# Patient Record
Sex: Male | Born: 1995
Health system: Southern US, Community
[De-identification: ages and names within clinical notes are randomized; demographics above are authoritative.]

## PROBLEM LIST (undated history)

## (undated) DIAGNOSIS — S43006A Unspecified dislocation of unspecified shoulder joint, initial encounter: Secondary | ICD-10-CM

## (undated) HISTORY — PX: ORCHIOPEXY: SHX479

## (undated) HISTORY — PX: TYMPANOSTOMY TUBE PLACEMENT: SHX32

## (undated) HISTORY — PX: WISDOM TOOTH EXTRACTION: SHX21

---
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
## 2015-01-01 ENCOUNTER — Ambulatory Visit (HOSPITAL_BASED_OUTPATIENT_CLINIC_OR_DEPARTMENT_OTHER): Payer: 59 | Admitting: Certified Registered"

## 2015-01-01 ENCOUNTER — Encounter (HOSPITAL_BASED_OUTPATIENT_CLINIC_OR_DEPARTMENT_OTHER): Payer: Self-pay | Admitting: Certified Registered"

## 2015-01-01 ENCOUNTER — Ambulatory Visit (HOSPITAL_BASED_OUTPATIENT_CLINIC_OR_DEPARTMENT_OTHER)
Admission: RE | Admit: 2015-01-01 | Discharge: 2015-01-01 | Disposition: A | Discharge status: Home / Self Care | Payer: 59 | Source: Ambulatory Visit | Attending: Orthopedic Surgery | Admitting: Orthopedic Surgery

## 2015-01-01 ENCOUNTER — Encounter (HOSPITAL_BASED_OUTPATIENT_CLINIC_OR_DEPARTMENT_OTHER)
Admission: RE | Disposition: A | Discharge status: Home / Self Care | Payer: Self-pay | Source: Ambulatory Visit | Attending: Orthopedic Surgery

## 2015-01-01 DIAGNOSIS — M24111 Other articular cartilage disorders, right shoulder: Secondary | ICD-10-CM | POA: Insufficient documentation

## 2015-01-01 DIAGNOSIS — M25311 Other instability, right shoulder: Secondary | ICD-10-CM | POA: Diagnosis not present

## 2015-01-01 DIAGNOSIS — M75101 Unspecified rotator cuff tear or rupture of right shoulder, not specified as traumatic: Secondary | ICD-10-CM | POA: Insufficient documentation

## 2015-01-01 DIAGNOSIS — S43014A Anterior dislocation of right humerus, initial encounter: Secondary | ICD-10-CM | POA: Diagnosis present

## 2015-01-01 HISTORY — DX: Unspecified dislocation of unspecified shoulder joint, initial encounter: S43.006A

## 2015-01-01 LAB — POCT HEMOGLOBIN-HEMACUE: HEMOGLOBIN: 15.7 g/dL (ref 13.0–17.0)

## 2015-01-01 SURGERY — SHOULDER ATHROSCOPY WITH CAPSULORRHAPHY
Anesthesia: Regional | Site: Shoulder | Laterality: Right

## 2015-01-01 MED ORDER — ROPIVACAINE HCL 5 MG/ML IJ SOLN
INTRAMUSCULAR | Status: DC | PRN
  Administered 2015-01-01: 30 mL via PERINEURAL

## 2015-01-01 MED ORDER — MIDAZOLAM HCL 2 MG/2ML IJ SOLN
INTRAMUSCULAR | Status: AC
  Filled 2015-01-01: qty 2

## 2015-01-01 MED ORDER — FENTANYL CITRATE 0.05 MG/ML IJ SOLN
INTRAMUSCULAR | Status: AC
  Filled 2015-01-01: qty 6

## 2015-01-01 MED ORDER — LIDOCAINE HCL (CARDIAC) 20 MG/ML IV SOLN
INTRAVENOUS | Status: DC | PRN
  Administered 2015-01-01: 30 mg via INTRAVENOUS

## 2015-01-01 MED ORDER — ONDANSETRON HCL 4 MG PO TABS: 4.0000 mg | ORAL_TABLET | Freq: Three times a day (TID) | ORAL | Status: DC | PRN

## 2015-01-01 MED ORDER — SENNA-DOCUSATE SODIUM 8.6-50 MG PO TABS: 2.0000 | ORAL_TABLET | Freq: Every day | ORAL | Status: DC

## 2015-01-01 MED ORDER — MIDAZOLAM HCL 5 MG/5ML IJ SOLN
INTRAMUSCULAR | Status: DC | PRN
  Administered 2015-01-01: 1 mg via INTRAVENOUS

## 2015-01-01 MED ORDER — ONDANSETRON HCL 4 MG/2ML IJ SOLN: 4.0000 mg | Freq: Once | INTRAMUSCULAR | Status: DC | PRN

## 2015-01-01 MED ORDER — MIDAZOLAM HCL 2 MG/ML PO SYRP: 12.0000 mg | ORAL_SOLUTION | Freq: Once | ORAL | Status: DC | PRN

## 2015-01-01 MED ORDER — PROPOFOL 10 MG/ML IV EMUL
INTRAVENOUS | Status: AC
  Filled 2015-01-01: qty 50

## 2015-01-01 MED ORDER — MEPERIDINE HCL 25 MG/ML IJ SOLN: 6.2500 mg | INTRAMUSCULAR | Status: DC | PRN

## 2015-01-01 MED ORDER — FENTANYL CITRATE 0.05 MG/ML IJ SOLN
50.0000 ug | INTRAMUSCULAR | Status: DC | PRN
  Administered 2015-01-01: 100 ug via INTRAVENOUS

## 2015-01-01 MED ORDER — OXYCODONE HCL 5 MG/5ML PO SOLN: 5.0000 mg | Freq: Once | ORAL | Status: DC | PRN

## 2015-01-01 MED ORDER — CEFAZOLIN SODIUM-DEXTROSE 2-3 GM-% IV SOLR
2.0000 g | INTRAVENOUS | Status: AC
  Administered 2015-01-01: 2 g via INTRAVENOUS

## 2015-01-01 MED ORDER — PROPOFOL 10 MG/ML IV BOLUS
INTRAVENOUS | Status: DC | PRN
  Administered 2015-01-01: 200 mg via INTRAVENOUS

## 2015-01-01 MED ORDER — SUCCINYLCHOLINE CHLORIDE 20 MG/ML IJ SOLN
INTRAMUSCULAR | Status: DC | PRN
  Administered 2015-01-01: 100 mg via INTRAVENOUS

## 2015-01-01 MED ORDER — BACLOFEN 10 MG PO TABS: 10.0000 mg | ORAL_TABLET | Freq: Three times a day (TID) | ORAL | Status: DC

## 2015-01-01 MED ORDER — SUCCINYLCHOLINE CHLORIDE 20 MG/ML IJ SOLN
INTRAMUSCULAR | Status: AC
Start: 2015-01-01 — End: 2015-01-01
  Filled 2015-01-01: qty 1

## 2015-01-01 MED ORDER — CEFAZOLIN SODIUM-DEXTROSE 2-3 GM-% IV SOLR
INTRAVENOUS | Status: AC
  Filled 2015-01-01: qty 50

## 2015-01-01 MED ORDER — FENTANYL CITRATE 0.05 MG/ML IJ SOLN
INTRAMUSCULAR | Status: AC
  Filled 2015-01-01: qty 2

## 2015-01-01 MED ORDER — FENTANYL CITRATE 0.05 MG/ML IJ SOLN
INTRAMUSCULAR | Status: DC | PRN
  Administered 2015-01-01: 50 ug via INTRAVENOUS

## 2015-01-01 MED ORDER — OXYCODONE HCL 5 MG PO TABS: 5.0000 mg | ORAL_TABLET | Freq: Once | ORAL | Status: DC | PRN

## 2015-01-01 MED ORDER — LACTATED RINGERS IV SOLN
INTRAVENOUS | Status: DC
  Administered 2015-01-01 (×2): via INTRAVENOUS

## 2015-01-01 MED ORDER — OXYCODONE-ACETAMINOPHEN 5-325 MG PO TABS: 1.0000 | ORAL_TABLET | Freq: Four times a day (QID) | ORAL | Status: DC | PRN

## 2015-01-01 MED ORDER — HYDROMORPHONE HCL 1 MG/ML IJ SOLN
INTRAMUSCULAR | Status: AC
  Filled 2015-01-01: qty 1

## 2015-01-01 MED ORDER — MIDAZOLAM HCL 2 MG/2ML IJ SOLN
1.0000 mg | INTRAMUSCULAR | Status: DC | PRN
  Administered 2015-01-01: 2 mg via INTRAVENOUS

## 2015-01-01 MED ORDER — DEXAMETHASONE SODIUM PHOSPHATE 4 MG/ML IJ SOLN
INTRAMUSCULAR | Status: DC | PRN
  Administered 2015-01-01: 10 mg via INTRAVENOUS

## 2015-01-01 MED ORDER — ONDANSETRON HCL 4 MG/2ML IJ SOLN
INTRAMUSCULAR | Status: DC | PRN
  Administered 2015-01-01: 4 mg via INTRAVENOUS

## 2015-01-01 MED ORDER — HYDROMORPHONE HCL 1 MG/ML IJ SOLN
0.2500 mg | INTRAMUSCULAR | Status: DC | PRN
  Administered 2015-01-01: 0.5 mg via INTRAVENOUS

## 2015-01-01 SURGICAL SUPPLY — 63 items
ANCHOR SUT BIOCOMP LK 2.9X12.5 (Anchor) ×4 IMPLANT
BLADE CUTTER GATOR 3.5 (BLADE) ×2 IMPLANT
BLADE GREAT WHITE 4.2 (BLADE) IMPLANT
BLADE SURG 15 STRL LF DISP TIS (BLADE) IMPLANT
BLADE SURG 15 STRL SS (BLADE)
BUR OVAL 6.0 (BURR) IMPLANT
CANNULA 5.75X71 LONG (CANNULA) ×2 IMPLANT
CANNULA TWIST IN 8.25X7CM (CANNULA) ×2 IMPLANT
CANNULA TWIST IN 8.25X9CM (CANNULA) IMPLANT
CLSR STERI-STRIP ANTIMIC 1/2X4 (GAUZE/BANDAGES/DRESSINGS) ×2 IMPLANT
DECANTER SPIKE VIAL GLASS SM (MISCELLANEOUS) IMPLANT
DRAPE INCISE IOBAN 66X45 STRL (DRAPES) ×4 IMPLANT
DRAPE SHOULDER BEACH CHAIR (DRAPES) ×2 IMPLANT
DRAPE U 20/CS (DRAPES) ×2 IMPLANT
DRAPE U-SHAPE 47X51 STRL (DRAPES) ×2 IMPLANT
DRSG PAD ABDOMINAL 8X10 ST (GAUZE/BANDAGES/DRESSINGS) ×2 IMPLANT
DURAPREP 26ML APPLICATOR (WOUND CARE) ×2 IMPLANT
ELECT REM PT RETURN 9FT ADLT (ELECTROSURGICAL)
ELECTRODE REM PT RTRN 9FT ADLT (ELECTROSURGICAL) IMPLANT
FIBERSTICK 2 (SUTURE) IMPLANT
GAUZE SPONGE 4X4 12PLY STRL (GAUZE/BANDAGES/DRESSINGS) ×2 IMPLANT
GLOVE BIO SURGEON STRL SZ8 (GLOVE) ×2 IMPLANT
GLOVE BIOGEL PI IND STRL 8 (GLOVE) ×2 IMPLANT
GLOVE BIOGEL PI INDICATOR 8 (GLOVE) ×2
GLOVE ORTHO TXT STRL SZ7.5 (GLOVE) ×2 IMPLANT
GOWN STRL REUS W/ TWL LRG LVL3 (GOWN DISPOSABLE) ×1 IMPLANT
GOWN STRL REUS W/ TWL XL LVL3 (GOWN DISPOSABLE) ×2 IMPLANT
GOWN STRL REUS W/TWL LRG LVL3 (GOWN DISPOSABLE) ×1
GOWN STRL REUS W/TWL XL LVL3 (GOWN DISPOSABLE) ×2
IMMOBILIZER SHOULDER FOAM XLGE (SOFTGOODS) IMPLANT
IV NS IRRIG 3000ML ARTHROMATIC (IV SOLUTION) ×6 IMPLANT
KIT PUSHLOCK 2.9 HIP (KITS) ×2 IMPLANT
KIT SHOULDER TRACTION (DRAPES) ×2 IMPLANT
LASSO 90 CVE QUICKPAS (DISPOSABLE) ×2 IMPLANT
MANIFOLD NEPTUNE II (INSTRUMENTS) ×2 IMPLANT
NEEDLE SCORPION MULTI FIRE (NEEDLE) IMPLANT
PACK ARTHROSCOPY DSU (CUSTOM PROCEDURE TRAY) ×2 IMPLANT
PACK BASIN DAY SURGERY FS (CUSTOM PROCEDURE TRAY) ×2 IMPLANT
SET ARTHROSCOPY TUBING (MISCELLANEOUS) ×1
SET ARTHROSCOPY TUBING LN (MISCELLANEOUS) ×1 IMPLANT
SHEET MEDIUM DRAPE 40X70 STRL (DRAPES) ×2 IMPLANT
SLEEVE SCD COMPRESS KNEE MED (MISCELLANEOUS) ×2 IMPLANT
SLING ARM IMMOBILIZER LRG (SOFTGOODS) IMPLANT
SLING ARM IMMOBILIZER MED (SOFTGOODS) IMPLANT
SLING ARM LRG ADULT FOAM STRAP (SOFTGOODS) IMPLANT
SLING ARM MED ADULT FOAM STRAP (SOFTGOODS) IMPLANT
SLING ARM XL FOAM STRAP (SOFTGOODS) IMPLANT
SUT FIBERWIRE #2 38 T-5 BLUE (SUTURE)
SUT MNCRL AB 4-0 PS2 18 (SUTURE) ×2 IMPLANT
SUT PDS AB 1 CT  36 (SUTURE)
SUT PDS AB 1 CT 36 (SUTURE) IMPLANT
SUT TIGER TAPE 7 IN WHITE (SUTURE) IMPLANT
SUT VIC AB 3-0 SH 27 (SUTURE)
SUT VIC AB 3-0 SH 27X BRD (SUTURE) IMPLANT
SUTURE FIBERWR #2 38 T-5 BLUE (SUTURE) IMPLANT
TAPE FIBER 2MM 7IN #2 BLUE (SUTURE) IMPLANT
TAPE LABRALWHITE 1.5X36 (TAPE) IMPLANT
TAPE SUT LABRALTAP WHT/BLK (SUTURE) ×4 IMPLANT
TOWEL OR 17X24 6PK STRL BLUE (TOWEL DISPOSABLE) ×2 IMPLANT
TOWEL OR NON WOVEN STRL DISP B (DISPOSABLE) ×4 IMPLANT
TUBE CONNECTING 20X1/4 (TUBING) IMPLANT
WAND STAR VAC 90 (SURGICAL WAND) IMPLANT
WATER STERILE IRR 1000ML POUR (IV SOLUTION) ×2 IMPLANT

## 2015-01-01 NOTE — Transfer of Care (Signed)
Immediate Anesthesia Transfer of Care Note  Patient: Michael Conley  Procedure(s) Performed: Procedure(s): RIGHT SHOULDER ATHROSCOPY WITH CAPSULORRHAPHY (Right)  Patient Location: PACU  Anesthesia Type:GA combined with regional for post-op pain  Level of Consciousness: awake, alert , oriented and patient cooperative  Airway & Oxygen Therapy: Patient Spontanous Breathing and Patient connected to face mask oxygen  Post-op Assessment: Report given to RN and Post -op Vital signs reviewed and stable  Post vital signs: Reviewed and stable  Last Vitals:  Filed Vitals:   01/01/15 1110  BP:   Pulse: 90  Temp:   Resp:     Complications: No apparent anesthesia complications

## 2015-01-01 NOTE — Op Note (Signed)
01/01/2015  10:53 AM  PATIENT:  Michael Conley    PRE-OPERATIVE DIAGNOSIS:  Right shoulder anterior instability with labral tear  POST-OPERATIVE DIAGNOSIS:  Same, with small undersurface supraspinatus tear, Buford complex anteriorly, with anterosuperior tearing of the labrum as well as anteroinferior labral tearing  PROCEDURE:  Right shoulder arthroscopy with Bankart capsulorrhaphy and extensive debridement of anterosuperior labrum and partial-thickness supraspinatus tear  SURGEON:  Eulas PostLANDAU,Refugio Vandevoorde P, MD  PHYSICIAN ASSISTANT: Janace LittenBrandon Parry, OPA-C, present and scrubbed throughout the case, critical for completion in a timely fashion, and for retraction, instrumentation, and closure.  ANESTHESIA:   General  PREOPERATIVE INDICATIONS:  Michael Conley is a  19 y.o. male who had multiple right shoulder dislocations who failed conservative measures and elected for surgical management.  The risks benefits and alternatives were discussed with the patient preoperatively including but not limited to the risks of infection, bleeding, nerve injury, cardiopulmonary complications, the need for revision surgery, among others, and the patient was willing to proceed.  OPERATIVE IMPLANTS: Arthrex bio composite 2.9 mm short push lock anchors x2. I used a total of 2 #2 labral fiber tapes in an inverted horizontal mattress configuration inferiorly, and simple configuration superiorly around the cord like middle glenohumeral ligament.   OPERATIVE FINDINGS: The shoulder was grossly unstable to anterior testing. Posteriorly he was stable. The articular surfaces were still in reasonably good condition, he had about 10% anterior glenoid bone loss, however the tissue quality was fairly poor. The labral tear was extremely large, and displaced, and extended from the 6:00 position up around the front all the way up to the anterior biceps anchor. The biceps anchor itself was actually stable, and I suspect that he had a  previous intrinsic Buford complex, which I did repair because of the anterior superior tearing, although I left the very top of the clock face without an anchor because I felt that I had already stabilize the shoulder and treated his superior labral tear with debridement and the biceps anchor was intact in the midline. The subscapularis was intact. There was about 10% undersurface fraying of the supraspinatus which I debrided. The biceps tendon was totally normal along its course. The pulley was intact. Infraspinatus and posterior labrum were intact.  OPERATIVE PROCEDURE: The patient was brought to the operating room and placed in the supine position. General anesthesia was administered. IV antibiotics were given. General anesthesia was administered.   The upper extremity was examined and found to be grossly unstable particularly to anterior testing. The upper extremity was prepped and draped in the usual sterile fashion. The patient was in a semilateral decubitus position.  Time out was performed. Diagnostic arthroscopy was carried out the above-named findings. I used the arthroscopic shaver to debride the anterosuperior labrum, as well as the undersurface of the supraspinatus, and also to prepare the anterior neck of the glenoid for a bleeding surface.  I placed 2 anterior cannulas, and then mobilized the labrum off of the medial neck of the glenoid with the spatula.  I then prepared the neck of the glenoid with a shaver/rasp to optimize healing, while still preserving the anterior bone stock.  The labrum had excellent mobility.   I then used a suture passer to pass an inverted labral fiber tape on either side of the inferior anterior glenohumeral ligament. This had excellent purchase on the tissue. The inferior limb had to be passed in 2 passes because the bite was so large.  I anchored the anterior inferior glenohumeral ligament into  the glenoid using a push lock anchor.   I then placed a second  suture slightly superiorly.  This was anchored above the 3:00 position using a push lock. The labrum itself was simply a cord of freely mobile tissue, and so I used a simple configuration in order to snare the labrum itself.  Excellent soft tissue restoration of tension was achieved, and the arthroscopic cannulas were removed, and the portals closed with Monocryl followed by Steri-Strips and sterile gauze. The patient was awakened and returned to the PACU in stable and satisfactory condition. There were no complications and the patient tolerated the procedure well.

## 2015-01-01 NOTE — Anesthesia Procedure Notes (Addendum)
Anesthesia Regional Block:  Interscalene brachial plexus block  Pre-Anesthetic Checklist: ,, timeout performed, Correct Patient, Correct Site, Correct Laterality, Correct Procedure, Correct Position, site marked, Risks and benefits discussed,  Surgical consent,  Pre-op evaluation,  At surgeon's request and post-op pain management  Laterality: Right  Prep: chloraprep       Needles:  Injection technique: Single-shot  Needle Type: Echogenic Stimulator Needle          Additional Needles:  Procedures: ultrasound guided (picture in chart) and nerve stimulator Interscalene brachial plexus block Narrative:  End time: 01/01/2015 8:42 AM Injection made incrementally with aspirations every 5 mL.  Performed by: Personally  Anesthesiologist: Milly JakobAVULAPATI, JANAKIRAM   Procedure Name: Intubation Date/Time: 01/01/2015 9:33 AM Performed by: Tawanda Schall D Pre-anesthesia Checklist: Patient identified, Emergency Drugs available, Suction available and Patient being monitored Patient Re-evaluated:Patient Re-evaluated prior to inductionOxygen Delivery Method: Circle System Utilized Preoxygenation: Pre-oxygenation with 100% oxygen Intubation Type: IV induction Ventilation: Mask ventilation without difficulty Laryngoscope Size: Mac and 3 Grade View: Grade II Tube type: Oral Number of attempts: 1 Airway Equipment and Method: Stylet Placement Confirmation: ETT inserted through vocal cords under direct vision,  positive ETCO2 and breath sounds checked- equal and bilateral Secured at: 22 cm Tube secured with: Tape Dental Injury: Teeth and Oropharynx as per pre-operative assessment

## 2015-01-01 NOTE — Anesthesia Postprocedure Evaluation (Signed)
  Anesthesia Post-op Note  Patient: Clarene CritchleyJacob K Mura  Procedure(s) Performed: Procedure(s): RIGHT SHOULDER ATHROSCOPY WITH CAPSULORRHAPHY (Right)  Patient Location: PACU  Anesthesia Type:General and Regional  Level of Consciousness: awake, alert  and oriented  Airway and Oxygen Therapy: Patient Spontanous Breathing  Post-op Pain: none  Post-op Assessment: Post-op Vital signs reviewed, Patient's Cardiovascular Status Stable, Respiratory Function Stable, Patent Airway and No signs of Nausea or vomiting  Post-op Vital Signs: Reviewed and stable  Last Vitals:  Filed Vitals:   01/01/15 1115  BP: 111/70  Pulse: 90  Temp:   Resp: 18    Complications: No apparent anesthesia complications

## 2015-01-01 NOTE — H&P (Signed)
PREOPERATIVE H&P  Chief Complaint: Right shoulder instability  HPI: Michael Conley is a 19 y.o. male who presents for preoperative history and physical with a diagnosis of right shoulder recurrent anterior instability. Symptoms are rated as moderate to severe, and have been worsening.  This is significantly impairing activities of daily living.  He has elected for surgical management. He has dislocated a total of 5 times, initially it started after he was trying to throw a ball and says that it came out for a total of about 10 minutes, and then self reduced. He has also had to have reductions done in the emergency room. He plays basketball. He also cannot throw because of his symptoms of instability. He has worked with physical therapy.  Past Medical History  Diagnosis Date  . Dislocated shoulder 12/2014    right   Past Surgical History  Procedure Laterality Date  . Wisdom tooth extraction    . Tympanostomy tube placement    . Orchiopexy     History   Social History  . Marital Status: Single    Spouse Name: N/A  . Number of Children: N/A  . Years of Education: N/A   Social History Main Topics  . Smoking status: Never Smoker   . Smokeless tobacco: Never Used  . Alcohol Use: No  . Drug Use: No  . Sexual Activity: Not on file   Other Topics Concern  . None   Social History Narrative   History reviewed. No pertinent family history. No Known Allergies Prior to Admission medications   Not on File     Positive ROS: All other systems have been reviewed and were otherwise negative with the exception of those mentioned in the HPI and as above.  Physical Exam: General: Alert, no acute distress Cardiovascular: No pedal edema Respiratory: No cyanosis, no use of accessory musculature GI: No organomegaly, abdomen is soft and non-tender Skin: No lesions in the area of chief complaint Neurologic: Sensation intact distally Psychiatric: Patient is competent for consent with normal  mood and affect Lymphatic: No axillary or cervical lymphadenopathy  MUSCULOSKELETAL: Right shoulder has 0-170, positive apprehension, cuff strength is intact, no pain over the acromioclavicular joint.  Assessment: Right shoulder recurrent anterior instability  Plan: Plan for Procedure(s): RIGHT SHOULDER ATHROSCOPY WITH CAPSULORRHAPHY  The risks benefits and alternatives were discussed with the patient including but not limited to the risks of nonoperative treatment, versus surgical intervention including infection, bleeding, nerve injury,  blood clots, cardiopulmonary complications, morbidity, mortality, among others, and they were willing to proceed. We have also discussed the risks for posttraumatic osteoarthritis, as well as recurrent instability and the need for revision surgery.  Eulas PostLANDAU,Azari Janssens P, MD Cell [phone]   01/01/2015 9:08 AM

## 2015-01-01 NOTE — Anesthesia Preprocedure Evaluation (Addendum)
Anesthesia Evaluation  Patient identified by MRN, date of birth, ID band Patient awake    Reviewed: Allergy & Precautions, NPO status   History of Anesthesia Complications (+) history of anesthetic complications  Airway Mallampati: I  TM Distance: >3 FB Neck ROM: Full    Dental   Pulmonary neg pulmonary ROS,  breath sounds clear to auscultation        Cardiovascular negative cardio ROS  Rhythm:Regular Rate:Normal     Neuro/Psych    GI/Hepatic negative GI ROS, Neg liver ROS,   Endo/Other  negative endocrine ROS  Renal/GU negative Renal ROS     Musculoskeletal   Abdominal   Peds  Hematology negative hematology ROS (+)   Anesthesia Other Findings   Reproductive/Obstetrics                            Anesthesia Physical Anesthesia Plan  ASA: I  Anesthesia Plan: General and Regional   Post-op Pain Management:    Induction: Intravenous  Airway Management Planned: Oral ETT  Additional Equipment:   Intra-op Plan:   Post-operative Plan: Extubation in OR  Informed Consent: I have reviewed the patients History and Physical, chart, labs and discussed the procedure including the risks, benefits and alternatives for the proposed anesthesia with the patient or authorized representative who has indicated his/her understanding and acceptance.   Dental advisory given  Plan Discussed with:   Anesthesia Plan Comments:         Anesthesia Quick Evaluation

## 2015-01-01 NOTE — Progress Notes (Signed)
Assisted Dr Julianne Riceavulapti with right, ultrasound guided, interscalene  block. Side rails up, monitors on throughout procedure. See vital signs in flow sheet. Tolerated Procedure well.

## 2015-01-01 NOTE — Discharge Instructions (Signed)
Diet: As you were doing prior to hospitalization   Shower:  May shower but keep the wounds dry, use an occlusive plastic wrap, NO SOAKING IN TUB.  If the bandage gets wet, change with a clean dry gauze.  Dressing:  You may change your dressing 3-5 days after surgery.  Then change the dressing daily with sterile gauze dressing.    There are sticky tapes (steri-strips) on your wounds and all the stitches are absorbable.  Leave the steri-strips in place when changing your dressings, they will peel off with time, usually 2-3 weeks.  Activity:  Increase activity slowly as tolerated, but follow the weight bearing instructions below.  No lifting or driving for 6 weeks.  Weight Bearing:   Sling at all times.    To prevent constipation: you may use a stool softener such as -  Colace (over the counter) 100 mg by mouth twice a day  Drink plenty of fluids (prune juice may be helpful) and high fiber foods Miralax (over the counter) for constipation as needed.    Itching:  If you experience itching with your medications, try taking only a single pain pill, or even half a pain pill at a time.  You may take up to 10 pain pills per day, and you can also use benadryl over the counter for itching or also to help with sleep.   Precautions:  If you experience chest pain or shortness of breath - call 911 immediately for transfer to the hospital emergency department!!  If you develop a fever greater that 101 F, purulent drainage from wound, increased redness or drainage from wound, or calf pain -- Call the office at [phone]                                                Follow- Up Appointment:  Please call for an appointment to be [place] in [place] [phone]  Call your surgeon if you experience:  1.  Fever over 101.0. 2.  Inability to urinate. 3.  Nausea and/or vomiting. 4.  Extreme swelling or bruising at the surgical site. 5.  Continued bleeding from the incision. 6.  Increased pain,  redness or drainage from the incision. 7.  Problems related to your pain medication. 8. Any change in color, movement and/or sensation 9. Any problems and/or concerns  Regional Anesthesia Blocks  1. Numbness or the inability to move the "blocked" extremity may last from 3-48 hours after placement. The length of time depends on the medication injected and your individual response to the medication. If the numbness is not going away after 48 hours, call your surgeon.  2. The extremity that is blocked will need to be protected until the numbness is gone and the  Strength has returned. Because you cannot feel it, you will need to take extra care to avoid injury. Because it may be weak, you may have difficulty moving it or using it. You may not know what position it is in without looking at it while the block is in effect.  3. Bruising and tenderness at the needle site are common side effects and will resolve in a few days.  4. Persistent numbness or new problems with movement should be communicated to the surgeon or the [facility] [phone])/ [facility] [phone]).   Post Anesthesia Home Care  Instructions Activity: Get plenty of rest for the remainder of the day. A responsible adult should stay with you for 24 hours following the procedure.  For the next 24 hours, DO NOT: -Drive a car -Advertising copywriter -Drink alcoholic beverages -Take any medication unless instructed by your physician -Make any legal decisions or sign important papers.  Meals: Start with liquid foods such as gelatin or soup. Progress to regular foods as tolerated. Avoid greasy, spicy, heavy foods. If nausea and/or vomiting occur, drink only clear liquids until the nausea and/or vomiting subsides. Call your physician if vomiting continues.  Special Instructions/Symptoms: Your throat may feel dry or sore from the anesthesia or the breathing tube placed in your throat during surgery. If this  causes discomfort, gargle with warm salt water. The discomfort should disappear within 24 hours.

## 2015-02-24 ENCOUNTER — Ambulatory Visit (HOSPITAL_COMMUNITY): Payer: 59 | Attending: Orthopedic Surgery

## 2015-02-24 DIAGNOSIS — M6289 Other specified disorders of muscle: Secondary | ICD-10-CM | POA: Insufficient documentation

## 2015-02-24 DIAGNOSIS — R531 Weakness: Secondary | ICD-10-CM | POA: Insufficient documentation

## 2015-03-01 ENCOUNTER — Ambulatory Visit (HOSPITAL_COMMUNITY): Payer: 59 | Attending: Orthopedic Surgery | Admitting: Occupational Therapy

## 2015-03-01 DIAGNOSIS — M629 Disorder of muscle, unspecified: Secondary | ICD-10-CM | POA: Insufficient documentation

## 2015-03-01 DIAGNOSIS — M6281 Muscle weakness (generalized): Secondary | ICD-10-CM | POA: Diagnosis not present

## 2015-03-01 DIAGNOSIS — S43491D Other sprain of right shoulder joint, subsequent encounter: Secondary | ICD-10-CM | POA: Diagnosis not present

## 2015-03-01 DIAGNOSIS — M6289 Other specified disorders of muscle: Secondary | ICD-10-CM

## 2015-03-01 DIAGNOSIS — S43491A Other sprain of right shoulder joint, initial encounter: Secondary | ICD-10-CM

## 2015-03-01 DIAGNOSIS — R531 Weakness: Secondary | ICD-10-CM

## 2015-03-01 NOTE — Therapy (Signed)
[facility] [address] [place], [place], [address] Phone: [phone]   Fax:  [phone]  Occupational Therapy Evaluation  Patient Details  Name: Michael Conley MRN: [id number] Date of Birth: 1995/12/08 Referring Provider:  Teryl LucyLandau, Joshua, MD  Encounter Date: 03/01/2015      OT End of Session - 03/01/15 1239    Visit Number 1   Number of Visits 6   Date for OT Re-Evaluation 04/30/15  Mini reassessment 03/30/2015   OT Start Time 1020   OT Stop Time 1100   OT Time Calculation (min) 40 min   Activity Tolerance Patient tolerated treatment well   Behavior During Therapy [facility] for tasks assessed/performed      Past Medical History  Diagnosis Date  . Dislocated shoulder 12/2014    right    Past Surgical History  Procedure Laterality Date  . Wisdom tooth extraction    . Tympanostomy tube placement    . Orchiopexy      There were no vitals filed for this visit.  Visit Diagnosis:  Bankart lesion of right shoulder, initial encounter  Decreased strength  Tight fascia      Subjective Assessment - 03/01/15 1229    Subjective  S: I can do everything I want to except lift trays at work and play basketball.    Pertinent History Pt is an 19 y/o male s/p right shoulder bankart reconstruction on 01/01/15 after several dislocations of right shoulder. Pt reports he wore a sling for 5-6 weeks post surgery. Dr. Dion SaucierLandau referred pt to occupational therapy for evaluation and treatment.    Patient Stated Goals To be able to play basketball   Currently in Pain? No/denies           [facility] - 03/01/15 1025    Assessment   Diagnosis s/p rt bankart reconstruction   Onset Date 01/01/15   Prior Therapy yes, ~2 yrs ago for right shoulder    Precautions   Precautions Shoulder   Type of Shoulder Precautions See Bankart protocol: 8-10 weeks (5/16-5/30)-AROM, strengthening including isotonic dumbells, closed chain exercises, theraband,  scapular stabilization & mobilization. 10-12 weeks (5/30-6/13)- advanced strengthening.    Balance Screen   Has the patient fallen in the past 6 months No   Has the patient had a decrease in activity level because of a fear of falling?  No   Is the patient reluctant to leave their home because of a fear of falling?  No   Home  Environment   Family/patient expects to be discharged to: Private residence   Living Arrangements Parent   Available Help at Discharge Family   Prior Function   Level of Independence Independent with basic ADLs   Vocation Part time employment   Vocation Requirements waiting tables, carrying trays etc   Leisure basketball, musician   ADL   ADL comments No problems to report. Is not able to carry trays overhead during work tasks. Isn't able to play basketball as usual    Written Expression   Dominant Hand Right   Vision - History   Baseline Vision No visual deficits   Cognition   Overall Cognitive Status Within Functional Limits for tasks assessed   ROM / Strength   AROM / PROM / Strength AROM;PROM;Strength   Palpation   Palpation Moderate fascial restrictions in right upper arm, trapezius, and scapularis regions.    AROM   Overall AROM Comments Assessed in standing, ER/IR adducted   AROM Assessment  Site Shoulder   Right/Left Shoulder Right   Right Shoulder Flexion 180 Degrees   Right Shoulder ABduction 174 Degrees   Right Shoulder Internal Rotation 90 Degrees   Right Shoulder External Rotation 67 Degrees              Strength   Overall Strength Comments Assessed seated, ER/IR adducted   Strength Assessment Site Shoulder   Right/Left Shoulder Right   Right Shoulder Flexion 4+/5   Right Shoulder ABduction 4+/5   Right Shoulder Internal Rotation 4/5   Right Shoulder External Rotation 4-/5             OT Short Term Goals - 03/01/15 1247    OT SHORT TERM GOAL #1   Title Pt will be educated on HEP.    Time 6   Period Weeks   Status New   OT  SHORT TERM GOAL #2   Title Pt will return to highest level of functioning and independence in all daily and work tasks.    Time 6   Period Weeks   Status New   OT SHORT TERM GOAL #3   Title Pt will decrease fascial restrictions from mod to min amounts or less.    Time 6   Period Weeks   Status New   OT SHORT TERM GOAL #4   Title Pt will increase strength to 5/5 to increase ability to carry trays at work.    Time 6   Period Weeks   Status New                  Plan - 03/01/15 1241    Clinical Impression Statement A: Pt is 19 y/o male s/p right shoulder bankart reconstruction on 01/01/2015. Pt presents with increased fascial restrictions and decreased strength limiting ability to perform work and leisure tasks. Pt reports MD instructed him not to lift anything over his head at this time. MD supplied bankart protocol to follow.    Pt will benefit from skilled therapeutic intervention in order to improve on the following deficits (Retired) Decreased strength;Impaired UE functional use;Increased fascial restricitons   Rehab Potential Excellent   OT Frequency 1x / week   OT Duration 6 weeks   OT Treatment/Interventions Self-care/ADL training;Passive range of motion;Patient/family education;Electrical Stimulation;Moist Heat;Therapeutic exercise;Manual Therapy;Therapeutic activities   Plan P: Patient will benefit from skilled OT intervention to improve ROM and strength and decrease fascial restrictions in order to return to prior level of independence with all functional activities, work, and return to basketball. Treatment Plan:MFR and PROM/ AROM, strengthening, rhythmic stabilization exercises, proximal shoulder strengthening, scapular stability exercises.         Problem List Patient Active Problem List   Diagnosis Date Noted  . Dislocation of shoulder, anterior, right, closed 09/05/2013    Ezra SitesLeslie Jamy Whyte, OTR/L  [phone]  03/01/2015, 12:50 PM  [facility] [address] [place], [place], [address] Phone: [phone]   Fax:  [phone]

## 2015-03-12 ENCOUNTER — Ambulatory Visit (HOSPITAL_COMMUNITY): Payer: 59 | Admitting: Specialist

## 2015-03-12 DIAGNOSIS — M629 Disorder of muscle, unspecified: Secondary | ICD-10-CM

## 2015-03-12 DIAGNOSIS — R531 Weakness: Secondary | ICD-10-CM

## 2015-03-12 DIAGNOSIS — M6289 Other specified disorders of muscle: Secondary | ICD-10-CM

## 2015-03-12 NOTE — Patient Instructions (Addendum)
DUMBBELL PUSH JERK - Alternate Arm: Start Position   - Elbow parallel to ground - Dumbbell resting on shoulder - Stance shoulder-width apart - Head up looking forward   Copyright  VHI. All rights reserved.      DUMBBELL PUSH JERK - Alternate Arm: Drive   - Torso tight - Accelerate dumbbell upward and backward - Drive up to toes, triple extension   Copyright  VHI. All rights reserved.  DUMBBELL PUSH JERK - Alternate Arm: Return Motion  - Lower dumbbell slowly, down to shoulder - While dipping in hips - QUICKLY DRIVE OTHER ARM, IN SAME MANNER   Copyright  VHI. All rights reserved.  ABDUCTION: Side-Lying (Active)   Lie on right side. Lift top arm as high as possible, keeping elbow straight. Use ___ lbs. Complete ___ sets of ___ repetitions. Perform ___ sessions per day.  Copyright  VHI. All rights reserved.  EXTERNAL ROTATION: Side-Lying (Active)   Lie on right side, top arm bent to 90, elbow against side, hand forward. Rotate forearm up as high as possible. Use ___ lbs. Complete ___ sets of ___ repetitions. Perform ___ sessions per day.  Copyright  VHI. All rights reserved.  External Rotation (Passive)   Lying on back, place arm out to side with elbow at right angle, using other arm to help. Hold ____ seconds. Repeat ____ times. Do ____ sessions per day.  Copyright  VHI. All rights reserved.

## 2015-03-12 NOTE — Therapy (Signed)
[facility] [address] [place], [place], [address] Phone: [phone]   Fax:  [phone]  Occupational Therapy Treatment  Patient Details  Name: Michael Conley MRN: [id number] Date of Birth: January 04, 1996 Referring Provider:  Teryl Lucy, MD  Encounter Date: 03/12/2015      OT End of Session - 03/12/15 0937    Visit Number 2   Number of Visits 6   Date for OT Re-Evaluation 04/30/15   OT Start Time 0850   OT Stop Time 0932   OT Time Calculation (min) 42 min   Activity Tolerance Patient tolerated treatment well   Behavior During Therapy [facility] for tasks assessed/performed      Past Medical History  Diagnosis Date  . Dislocated shoulder 12/2014    right    Past Surgical History  Procedure Laterality Date  . Wisdom tooth extraction    . Tympanostomy tube placement    . Orchiopexy      There were no vitals filed for this visit.  Visit Diagnosis:  Decreased strength  Tight fascia      Subjective Assessment - 03/12/15 0918    Subjective  S:  I cant do overhead pushing, I tried to help my dad lift a camper onto the back of the truck and I just couldnt do that motion.    Currently in Pain? No/denies            [facility] OT Assessment - 03/12/15 0001    Assessment   Diagnosis s/p rt bankart reconstruction   Onset Date 01/01/15   Precautions   Precautions Shoulder   Type of Shoulder Precautions See Bankart protocol: 8-10 weeks (5/16-5/30)-AROM, strengthening including isotonic dumbells, closed chain exercises, theraband, scapular stabilization & mobilization. 10-12 weeks (5/30-6/13)- advanced strengthening.                   OT Treatments/Exercises (OP) - 03/12/15 0001    Exercises   Exercises Shoulder   Shoulder Exercises: Supine   Horizontal ABduction PROM;5 reps   External Rotation PROM;5 reps;Strengthening;15 reps   External Rotation Weight (lbs) 3   Internal Rotation PROM;5 reps   Flexion PROM;5 reps    ABduction PROM;5 reps   Other Supine Exercises serratus anterior punch 15 times with 3#   Shoulder Exercises: Prone   Retraction Strengthening;15 reps   Retraction Weight (lbs) 3   Flexion Strengthening;15 reps   Flexion Weight (lbs) 3   Extension Strengthening;15 reps   Extension Weight (lbs) 3   Horizontal ABduction 1 Strengthening;15 reps   Horizontal ABduction 1 Weight (lbs) 3   Horizontal ABduction 2 Strengthening;15 reps   Horizontal ABduction 2 Weight (lbs) 3   Other Prone Exercises scaption 3# X 15   Shoulder Exercises: Sidelying   External Rotation Strengthening;15 reps   External Rotation Weight (lbs) 3   Internal Rotation Strengthening;15 reps   Internal Rotation Weight (lbs) 3   Flexion Strengthening;15 reps   Flexion Weight (lbs) 3   ABduction Strengthening;15 reps   ABduction Weight (lbs) 3   Shoulder Exercises: Standing   Other Standing Exercises holding therapy ball with extended elbows completed chest press, overhead press, overhead press right and left 15 times each   Other Standing Exercises overhead press with 3# 15 times   Shoulder Exercises: ROM/Strengthening   UBE (Upper Arm Bike) 2.0 level 3' forward and 3' reverse   Wall Pushups 15 reps  table pushup   Proximal Shoulder Strengthening, Supine 10 times each holding 3#  without resting   Rhythmic Stabilization, Supine holding 3# at 90, 120,45 for 20 seconds with min difficulty   Manual Therapy   Manual Therapy Myofascial release   Myofascial Release MFR to right upper arm, scapular and shoulder region to decrease pain and stfffness with end range mobility                OT Education - 03/12/15 0936    Education provided Yes   Education Details overhead press, sidelying external roation, abduction, supine external rotation stretch with weight   Person(s) Educated Patient   Methods Explanation;Demonstration;Handout   Comprehension Verbalized understanding;Returned demonstration           OT Short Term Goals - 03/12/15 0940    OT SHORT TERM GOAL #1   Title Pt will be educated on HEP.    Time 6   Period Weeks   Status On-going   OT SHORT TERM GOAL #2   Title Pt will return to highest level of functioning and independence in all daily and work tasks.    Time 6   Period Weeks   Status On-going   OT SHORT TERM GOAL #3   Title Pt will decrease fascial restrictions from mod to min amounts or less.    Time 6   Period Weeks   Status On-going   OT SHORT TERM GOAL #4   Title Pt will increase strength to 5/5 to increase ability to carry trays at work.    Time 6   Period Weeks   Status On-going                  Plan - 03/12/15 [id number]    Clinical Impression Statement A:  Patient fatigued at end of session, issued HEP to work on end range external rotation and strengthening of overhead press movement needed to lift trays overhead at work.    Plan P:  Follow up on HEP.  add planks ball walk ups        Problem List Patient Active Problem List   Diagnosis Date Noted  . Dislocation of shoulder, anterior, right, closed 09/05/2013    Shirlean MylarBethany H. Ginette Bradway, OTR/L [phone]  03/12/2015, 9:41 AM  [facility] [address] [place], [place], [address] Phone: [phone]   Fax:  [phone]

## 2015-04-02 ENCOUNTER — Ambulatory Visit (HOSPITAL_COMMUNITY): Payer: 59 | Attending: Orthopedic Surgery

## 2015-04-02 DIAGNOSIS — M6289 Other specified disorders of muscle: Secondary | ICD-10-CM | POA: Insufficient documentation

## 2015-04-02 DIAGNOSIS — R531 Weakness: Secondary | ICD-10-CM | POA: Insufficient documentation

## 2015-04-07 ENCOUNTER — Ambulatory Visit (HOSPITAL_COMMUNITY): Payer: 59 | Admitting: Occupational Therapy

## 2015-04-14 ENCOUNTER — Ambulatory Visit (HOSPITAL_COMMUNITY): Payer: 59

## 2015-04-14 ENCOUNTER — Encounter (HOSPITAL_COMMUNITY): Payer: Self-pay | Admitting: Specialist

## 2015-04-14 NOTE — Therapy (Deleted)
[facility] [address] [place], [place], [address] Phone: [phone]   Fax:  [phone]  April 14, 2015    @CCLISTADDRESS @  Occupational Therapy Discharge Summary   Patient: Clarene CritchleyJacob K Babic MRN: [id number] Date of Birth: 08/12/1996  Diagnosis: No diagnosis found.  Referring Provider:  No ref. provider found  The above patient had been seen in Occupational Therapy *** times of *** treatments scheduled with *** no shows and *** cancellations.  The treatment consisted of *** The patient is: {improved/worse/unchanged:3041574}  Subjective: ***  Discharge Findings: ***  Functional Status at Discharge: ***  {ONGEX:5284132}{GOALS:3041575}     Sincerely,   Jacqualine CodeMurray, Alyxander Kollmann Helene, OTR    CC @CCLISTRESTNAME @  [facility] [address] [place], [place], [address] Phone: [phone]   Fax:  [phone]

## 2015-04-14 NOTE — Therapy (Signed)
OCCUPATIONAL THERAPY DISCHARGE SUMMARY  Visits from Start of Care: 4 Current functional level related to goals / functional outcomes:  Independent with all B/IADLs and leisure activites.   Remaining deficits: Difficulty lifting trays overhead at work.   Education / Equipment: Cytogeneticist HEP given.  OT Short Term Goals - 03/12/15 0940    OT SHORT TERM GOAL #1   Title Pt will be educated on HEP.    Time 6   Period Weeks   Status On-going   OT SHORT TERM GOAL #2   Title Pt will return to highest level of functioning and independence in all daily and work tasks.    Time 6   Period Weeks   Status On-going   OT SHORT TERM GOAL #3   Title Pt will decrease fascial restrictions from mod to min amounts or less.    Time 6   Period Weeks   Status On-going   OT SHORT TERM GOAL #4   Title Pt will increase strength to 5/5 to increase ability to carry trays at work.    Time 6   Period Weeks   Status On-going           Plan: Patient agrees to discharge.  Patient goals were partially met. Patient is being discharged due to not returning since the last visit.  ?????          Vangie Bicker, OTR/L [phone]

## 2015-04-21 ENCOUNTER — Encounter (HOSPITAL_COMMUNITY): Payer: 59

## 2016-03-15 DIAGNOSIS — J302 Other seasonal allergic rhinitis: Secondary | ICD-10-CM | POA: Diagnosis not present

## 2016-03-15 DIAGNOSIS — Z68.41 Body mass index (BMI) pediatric, less than 5th percentile for age: Secondary | ICD-10-CM | POA: Diagnosis not present

## 2016-03-15 DIAGNOSIS — R070 Pain in throat: Secondary | ICD-10-CM | POA: Diagnosis not present

## 2016-03-15 DIAGNOSIS — J029 Acute pharyngitis, unspecified: Secondary | ICD-10-CM | POA: Diagnosis not present

## 2016-03-15 DIAGNOSIS — J343 Hypertrophy of nasal turbinates: Secondary | ICD-10-CM | POA: Diagnosis not present

## 2016-03-15 DIAGNOSIS — J069 Acute upper respiratory infection, unspecified: Secondary | ICD-10-CM | POA: Diagnosis not present

## 2016-03-15 DIAGNOSIS — Z1389 Encounter for screening for other disorder: Secondary | ICD-10-CM | POA: Diagnosis not present

## 2016-07-18 ENCOUNTER — Telehealth: Payer: 59 | Admitting: Nurse Practitioner

## 2016-07-18 DIAGNOSIS — J029 Acute pharyngitis, unspecified: Secondary | ICD-10-CM | POA: Diagnosis not present

## 2016-07-18 MED ORDER — AMOXICILLIN-POT CLAVULANATE 875-125 MG PO TABS: 1.0000 | ORAL_TABLET | Freq: Two times a day (BID) | ORAL | 0 refills | Status: DC

## 2016-07-18 NOTE — Progress Notes (Signed)
We are sorry that you are not feeling well.  Here is how we plan to help!  Based on what you have shared with me it looks like you have  pharyngitis.  Strep pharyngitis is inflammation and infection in the back of the throat.  This is an infection caused by bacteria and is treated with antibiotics. I have prescribed Augmentin 875mg /125mg  one tablet twice daily with food, for 7 days. one tablet twice daily with food, for 7 days. You may use an oral throat lozenges as needed. Strep infections are not as easily transmitted as other respiratory infection, however we still recommend that you avoid close contact with loved ones, especially the very young and elderly.  Remember to wash your hands thoroughly throughout the day as this is the number one way to prevent the spread of infection!  Home Care:  Only take medications as instructed by your medical team.  Complete the entire course of an antibiotic.  Do not take these medications with alcohol.  A steam or ultrasonic humidifier can help congestion.  You can place a towel over your head and breathe in the steam from hot water coming from a faucet.  Avoid close contacts especially the very young and the elderly.  Cover your mouth when you cough or sneeze.  Always remember to wash your hands.  Get Help Right Away If:  You develop worsening fever or sinus pain.  You develop a severe head ache or visual changes.  Your symptoms persist after you have completed your treatment plan.  Make sure you  Understand these instructions.  Will watch your condition.  Will get help right away if you are not doing well or get worse.  Your e-visit answers were reviewed by a board certified advanced clinical practitioner to complete your personal care plan.  Depending on the condition, your plan could have included both over the counter or prescription medications.  If there is a problem please reply  once you have received a response from your  provider.  Your safety is important to us.  If you have drug allergies check your prescription carefully.    You can use MyChart to ask questions about today's visit, request a non-urgent call back, or ask for a work or school excuse for 24 hours related to this e-Visit. If it has been greater than 24 hours you will need to follow up with your provider, or enter a new e-Visit to address those concerns.  You will get an e-mail in the next two days asking about your experience.  I hope that your e-visit has been valuable and will speed your recovery. Thank you for using e-visits.

## 2016-11-03 ENCOUNTER — Ambulatory Visit (HOSPITAL_COMMUNITY)
Admission: RE | Admit: 2016-11-03 | Discharge: 2016-11-03 | Disposition: A | Discharge status: Home / Self Care | Payer: 59 | Source: Ambulatory Visit | Attending: Registered Nurse | Admitting: Registered Nurse

## 2016-11-03 ENCOUNTER — Other Ambulatory Visit (HOSPITAL_COMMUNITY): Payer: Self-pay | Admitting: Registered Nurse

## 2016-11-03 DIAGNOSIS — Z68.41 Body mass index (BMI) pediatric, less than 5th percentile for age: Secondary | ICD-10-CM | POA: Diagnosis not present

## 2016-11-03 DIAGNOSIS — R1013 Epigastric pain: Secondary | ICD-10-CM

## 2016-11-03 DIAGNOSIS — R1110 Vomiting, unspecified: Secondary | ICD-10-CM | POA: Diagnosis not present

## 2016-11-03 DIAGNOSIS — R109 Unspecified abdominal pain: Secondary | ICD-10-CM | POA: Insufficient documentation

## 2016-11-03 DIAGNOSIS — Z1389 Encounter for screening for other disorder: Secondary | ICD-10-CM | POA: Diagnosis not present

## 2016-11-03 DIAGNOSIS — E669 Obesity, unspecified: Secondary | ICD-10-CM | POA: Diagnosis not present

## 2016-11-03 MED ORDER — IOPAMIDOL (ISOVUE-300) INJECTION 61%
INTRAVENOUS | Status: AC
  Filled 2016-11-03: qty 30

## 2017-11-16 ENCOUNTER — Telehealth: Payer: 59 | Admitting: Nurse Practitioner

## 2017-11-16 DIAGNOSIS — R059 Cough, unspecified: Secondary | ICD-10-CM

## 2017-11-16 DIAGNOSIS — R05 Cough: Secondary | ICD-10-CM

## 2017-11-16 NOTE — Progress Notes (Signed)

## 2017-11-27 ENCOUNTER — Encounter (INDEPENDENT_AMBULATORY_CARE_PROVIDER_SITE_OTHER): Payer: Self-pay | Admitting: *Deleted

## 2017-11-27 ENCOUNTER — Encounter (INDEPENDENT_AMBULATORY_CARE_PROVIDER_SITE_OTHER): Payer: Self-pay | Admitting: Internal Medicine

## 2017-11-27 ENCOUNTER — Ambulatory Visit (INDEPENDENT_AMBULATORY_CARE_PROVIDER_SITE_OTHER): Payer: 59 | Admitting: Internal Medicine

## 2017-11-27 VITALS — BP 140/82 | HR 64 | Temp 98.1°F | Wt 245.4 lb

## 2017-11-27 DIAGNOSIS — R142 Eructation: Secondary | ICD-10-CM

## 2017-11-27 DIAGNOSIS — R1010 Upper abdominal pain, unspecified: Secondary | ICD-10-CM

## 2017-11-27 MED ORDER — PANTOPRAZOLE SODIUM 40 MG PO TBEC: 40.0000 mg | DELAYED_RELEASE_TABLET | Freq: Every day | ORAL | 2 refills | Status: DC

## 2017-11-27 NOTE — Patient Instructions (Addendum)
Food Choices for Gastroesophageal Reflux Disease, Adult When you have gastroesophageal reflux disease (GERD), the foods you eat and your eating habits are very important. Choosing the right foods can help ease your discomfort. What guidelines do I need to follow?  Choose fruits, vegetables, whole grains, and low-fat dairy products.  Choose low-fat meat, fish, and poultry.  Limit fats such as oils, salad dressings, butter, nuts, and avocado.  Keep a food diary. This helps you identify foods that cause symptoms.  Avoid foods that cause symptoms. These may be different for everyone.  Eat small meals often instead of 3 large meals a day.  Eat your meals slowly, in a place where you are relaxed.  Limit fried foods.  Cook foods using methods other than frying.  Avoid drinking alcohol.  Avoid drinking large amounts of liquids with your meals.  Avoid bending over or lying down until 2-3 hours after eating. What foods are not recommended? These are some foods and drinks that may make your symptoms worse: Vegetables Tomatoes. Tomato juice. Tomato and spaghetti sauce. Chili peppers. Onion and garlic. Horseradish. Fruits Oranges, grapefruit, and lemon (fruit and juice). Meats High-fat meats, fish, and poultry. This includes hot dogs, ribs, ham, sausage, salami, and bacon. Dairy Whole milk and chocolate milk. Sour cream. Cream. Butter. Ice cream. Cream cheese. Drinks Coffee and tea. Bubbly (carbonated) drinks or energy drinks. Condiments Hot sauce. Barbecue sauce. Sweets/Desserts Chocolate and cocoa. Donuts. Peppermint and spearmint. Fats and Oils High-fat foods. This includes French fries and potato chips. Other Vinegar. Strong spices. This includes black pepper, white pepper, red pepper, cayenne, curry powder, cloves, ginger, and chili powder. The items listed above may not be a complete list of foods and drinks to avoid. Contact your dietitian for more information. This  information is not intended to replace advice given to you by your health care provider. Make sure you discuss any questions you have with your health care provider. Document Released: 04/02/2012 Document Revised: 03/09/2016 Document Reviewed: 08/06/2013 Elsevier Interactive Patient Education  2017 Elsevier Inc.  Gastroesophageal Reflux Disease, Adult Normally, food travels down the esophagus and stays in the stomach to be digested. If a person has gastroesophageal reflux disease (GERD), food and stomach acid move back up into the esophagus. When this happens, the esophagus becomes sore and swollen (inflamed). Over time, GERD can make small holes (ulcers) in the lining of the esophagus. Follow these instructions at home: Diet  Follow a diet as told by your doctor. You may need to avoid foods and drinks such as: ? Coffee and tea (with or without caffeine). ? Drinks that contain alcohol. ? Energy drinks and sports drinks. ? Carbonated drinks or sodas. ? Chocolate and cocoa. ? Peppermint and mint flavorings. ? Garlic and onions. ? Horseradish. ? Spicy and acidic foods, such as peppers, chili powder, curry powder, vinegar, hot sauces, and BBQ sauce. ? Citrus fruit juices and citrus fruits, such as oranges, lemons, and limes. ? Tomato-based foods, such as red sauce, chili, salsa, and pizza with red sauce. ? Fried and fatty foods, such as donuts, french fries, potato chips, and high-fat dressings. ? High-fat meats, such as hot dogs, rib eye steak, sausage, ham, and bacon. ? High-fat dairy items, such as whole milk, butter, and cream cheese.  Eat small meals often. Avoid eating large meals.  Avoid drinking large amounts of liquid with your meals.  Avoid eating meals during the 2-3 hours before bedtime.  Avoid lying down right after you eat.  Do   not exercise right after you eat. General instructions  Pay attention to any changes in your symptoms.  Take over-the-counter and prescription  medicines only as told by your doctor. Do not take aspirin, ibuprofen, or other NSAIDs unless your doctor says it is okay.  Do not use any tobacco products, including cigarettes, chewing tobacco, and e-cigarettes. If you need help quitting, ask your doctor.  Wear loose clothes. Do not wear anything tight around your waist.  Raise (elevate) the head of your bed about 6 inches (15 cm).  Try to lower your stress. If you need help doing this, ask your doctor.  If you are overweight, lose an amount of weight that is healthy for you. Ask your doctor about a safe weight loss goal.  Keep all follow-up visits as told by your doctor. This is important. Contact a doctor if:  You have new symptoms.  You lose weight and you do not know why it is happening.  You have trouble swallowing, or it hurts to swallow.  You have wheezing or a cough that keeps happening.  Your symptoms do not get better with treatment.  You have a hoarse voice. Get help right away if:  You have pain in your arms, neck, jaw, teeth, or back.  You feel sweaty, dizzy, or light-headed.  You have chest pain or shortness of breath.  You throw up (vomit) and your throw up looks like blood or coffee grounds.  You pass out (faint).  Your poop (stool) is bloody or black.  You cannot swallow, drink, or eat. This information is not intended to replace advice given to you by your health care provider. Make sure you discuss any questions you have with your health care provider. Document Released: 03/20/2008 Document Revised: 03/09/2016 Document Reviewed: 01/27/2015 Elsevier Interactive Patient Education  2018 Elsevier Inc.  

## 2017-11-27 NOTE — Progress Notes (Signed)
   Subjective:    Patient ID: Michael Conley, male    DOB: 1996-05-16, 22 y.o.   MRN: [id number]  HPI Referred by Michael Conley for abdominal pain. Symptoms include feeling that food is just sitting in stomach. Pain lower abdomen. Has relief with belching. Has had some nausea and vomiting and epigastric pain. He tells me today, his pain is sporadic. After he eats, he belches. He says the belches are deep.  Sometimes he has vomiting. Once he vomits, he has relief. Foods feel like they ar slow to go down. Brownies cause him to have deep belching. He has stomach cramps and pain. Pepto and Tums help. He avoid the brownies and beef tips.  He does have acid reflux. Over the past 2 weeks he has seen blood x 2. No NSAIDs.  Does not think he has hemorrhoids. He does have constipation.  Greasy foods bother him at times.    11/06/2016 H and H 14.5 and 41.9, 11/03/2016 total bili 0.4, ALP 95, AST 23, ALT 30. H. Pylori negative Lipase 17 11/03/2016 CT abdomen wo CM: belching, vomiting.  No acute abnormality.    Review of Systems Past Medical History:  Diagnosis Date  . Dislocated shoulder 12/2014   right    Past Surgical History:  Procedure Laterality Date  . ORCHIOPEXY    . TYMPANOSTOMY TUBE PLACEMENT    . WISDOM TOOTH EXTRACTION      No Known Allergies  Current Outpatient Medications on File Prior to Visit  Medication Sig Dispense Refill  . bismuth subsalicylate (PEPTO BISMOL) 262 MG/15ML suspension Take 30 mLs by mouth every 6 (six) hours as needed.    . calcium carbonate (TUMS - DOSED IN MG ELEMENTAL CALCIUM) 500 MG chewable tablet Chew 1 tablet by mouth daily.     No current facility-administered medications on file prior to visit.         Objective:   Physical Exam Blood pressure 140/82, pulse 64, temperature 98.1 F (36.7 C), weight 245 lb 6.4 oz (111.3 kg). Alert and oriented. Skin warm and dry. Oral mucosa is moist.   . Sclera anicteric, conjunctivae is pink. Thyroid not  enlarged. No cervical lymphadenopathy. Lungs clear. Heart regular rate and rhythm.  Abdomen is soft. Bowel sounds are positive. No hepatomegaly. No abdominal masses felt. No tenderness.  No edema to lower extremities.           Assessment & Plan:  Probably GERD. Am going to start him on Protonix and bring back in 8 weeks. Rx for Protonix sent to his pharmacy.  Will also get a HIDA scan to rule out GB disease.

## 2017-11-28 ENCOUNTER — Telehealth: Payer: 59 | Admitting: Family

## 2017-11-28 DIAGNOSIS — L03114 Cellulitis of left upper limb: Secondary | ICD-10-CM | POA: Diagnosis not present

## 2017-11-28 MED ORDER — CEPHALEXIN 500 MG PO CAPS: 500.0000 mg | ORAL_CAPSULE | Freq: Two times a day (BID) | ORAL | 0 refills | Status: DC

## 2017-11-28 NOTE — Progress Notes (Signed)
E Visit for Rash  We are sorry that you are not feeling well. Here is how we plan to help!  Based upon what you have shared with me it looks like you have cellulitis, bacterial infection, of your arm.  Cellulites  infection of the skin and is treated with an antibiotic. I have prescribed: and Keflex 500 mg three times per day for 7 days   HOME CARE:   Take cool showers and avoid direct sunlight.  Apply cool compress or wet dressings.  Take a bath in an oatmeal bath.  Sprinkle content of one Aveeno packet under running faucet with comfortably warm water.  Bathe for 15-20 minutes, 1-2 times daily.  Pat dry with a towel. Do not rub the rash.  Use hydrocortisone cream.  Take an antihistamine like Benadryl for widespread rashes that itch.  The adult dose of Benadryl is 25-50 mg by mouth 4 times daily.  Caution:  This type of medication may cause sleepiness.  Do not drink alcohol, drive, or operate dangerous machinery while taking antihistamines.  Do not take these medications if you have prostate enlargement.  Read package instructions thoroughly on all medications that you take.  GET HELP RIGHT AWAY IF:   Symptoms don't go away after treatment.  Severe itching that persists.  If you rash spreads or swells.  If you rash begins to smell.  If it blisters and opens or develops a yellow-brown crust.  You develop a fever.  You have a sore throat.  You become short of breath.  MAKE SURE YOU:  Understand these instructions. Will watch your condition. Will get help right away if you are not doing well or get worse.  Thank you for choosing an e-visit. Your e-visit answers were reviewed by a board certified advanced clinical practitioner to complete your personal care plan. Depending upon the condition, your plan could have included both over the counter or prescription medications. Please review your pharmacy choice. Be sure that the pharmacy you have chosen is open so that you can  pick up your prescription now.  If there is a problem you may message your provider in MyChart to have the prescription routed to another pharmacy. Your safety is important to us. If you have drug allergies check your prescription carefully.  For the next 24 hours, you can use MyChart to ask questions about today's visit, request a non-urgent call back, or ask for a work or school excuse from your e-visit provider. You will get an email in the next two days asking about your experience. I hope that your e-visit has been valuable and will speed your recovery.

## 2017-12-04 ENCOUNTER — Encounter (HOSPITAL_COMMUNITY): Payer: 59

## 2017-12-04 ENCOUNTER — Ambulatory Visit (INDEPENDENT_AMBULATORY_CARE_PROVIDER_SITE_OTHER): Payer: 59 | Admitting: Internal Medicine

## 2017-12-11 ENCOUNTER — Encounter (HOSPITAL_COMMUNITY): Admission: RE | Admit: 2017-12-11 | Payer: 59 | Source: Ambulatory Visit

## 2018-02-04 ENCOUNTER — Encounter (HOSPITAL_COMMUNITY): Payer: 59

## 2018-02-07 ENCOUNTER — Encounter (HOSPITAL_COMMUNITY)
Admission: RE | Admit: 2018-02-07 | Discharge: 2018-02-07 | Disposition: A | Discharge status: Home / Self Care | Payer: 59 | Source: Ambulatory Visit | Attending: Internal Medicine | Admitting: Internal Medicine

## 2018-02-07 ENCOUNTER — Encounter (HOSPITAL_COMMUNITY): Payer: Self-pay

## 2018-02-07 DIAGNOSIS — R1010 Upper abdominal pain, unspecified: Secondary | ICD-10-CM | POA: Insufficient documentation

## 2018-02-07 DIAGNOSIS — R112 Nausea with vomiting, unspecified: Secondary | ICD-10-CM | POA: Diagnosis not present

## 2018-02-07 DIAGNOSIS — R109 Unspecified abdominal pain: Secondary | ICD-10-CM | POA: Diagnosis not present

## 2018-02-07 MED ORDER — TECHNETIUM TC 99M MEBROFENIN IV KIT
5.0000 | PACK | Freq: Once | INTRAVENOUS | Status: AC | PRN
  Administered 2018-02-07: 5.1 via INTRAVENOUS

## 2018-02-27 DIAGNOSIS — J301 Allergic rhinitis due to pollen: Secondary | ICD-10-CM | POA: Diagnosis not present

## 2018-02-27 DIAGNOSIS — Z0001 Encounter for general adult medical examination with abnormal findings: Secondary | ICD-10-CM | POA: Diagnosis not present

## 2018-02-27 DIAGNOSIS — Z1389 Encounter for screening for other disorder: Secondary | ICD-10-CM | POA: Diagnosis not present

## 2018-02-27 DIAGNOSIS — Z6837 Body mass index (BMI) 37.0-37.9, adult: Secondary | ICD-10-CM | POA: Diagnosis not present

## 2018-02-27 DIAGNOSIS — J019 Acute sinusitis, unspecified: Secondary | ICD-10-CM | POA: Diagnosis not present

## 2018-04-01 DIAGNOSIS — L509 Urticaria, unspecified: Secondary | ICD-10-CM | POA: Diagnosis not present

## 2018-04-01 DIAGNOSIS — Z6838 Body mass index (BMI) 38.0-38.9, adult: Secondary | ICD-10-CM | POA: Diagnosis not present

## 2018-05-01 DIAGNOSIS — S92515A Nondisplaced fracture of proximal phalanx of left lesser toe(s), initial encounter for closed fracture: Secondary | ICD-10-CM | POA: Diagnosis not present

## 2018-05-01 DIAGNOSIS — M25562 Pain in left knee: Secondary | ICD-10-CM | POA: Diagnosis not present

## 2018-05-15 DIAGNOSIS — M25562 Pain in left knee: Secondary | ICD-10-CM | POA: Diagnosis not present

## 2018-09-10 ENCOUNTER — Telehealth: Payer: 59 | Admitting: Family Medicine

## 2018-09-10 DIAGNOSIS — J329 Chronic sinusitis, unspecified: Secondary | ICD-10-CM

## 2018-09-10 MED ORDER — AMOXICILLIN-POT CLAVULANATE 875-125 MG PO TABS: 1.0000 | ORAL_TABLET | Freq: Two times a day (BID) | ORAL | 0 refills | Status: AC

## 2018-09-10 NOTE — Progress Notes (Signed)
We are sorry that you are not feeling well.  Here is how we plan to help!  Based on what you have shared with me it looks like you have sinusitis.  Sinusitis is inflammation and infection in the sinus cavities of the head.  Based on your presentation I believe you most likely have Acute Bacterial Sinusitis.  This is an infection caused by bacteria and is treated with antibiotics. I have prescribed Augmentin 875mg /125mg  one tablet twice daily with food, for 7 days. You may use an oral decongestant such as Mucinex D or if you have glaucoma or high blood pressure use plain Mucinex. Saline nasal spray help and can safely be used as often as needed for congestion.  If you develop worsening sinus pain, fever or notice severe headache and vision changes, or if symptoms are not better after completion of antibiotic, please schedule an appointment with a health care provider.    Usually with only 3 days of treatment the condition is viral. Due to your notation of pending travel an antibiotic was provided today. I would encourage you to increase hydration, continue the OTC decongestants and nasal saline rinses to attempt to treat symptoms without the antibiotics. But you have them if you need them.  Sinus infections are not as easily transmitted as other respiratory infection, however we still recommend that you avoid close contact with loved ones, especially the very young and elderly.  Remember to wash your hands thoroughly throughout the day as this is the number one way to prevent the spread of infection!  Home Care:  Only take medications as instructed by your medical team.  Complete the entire course of an antibiotic.  Do not take these medications with alcohol.  A steam or ultrasonic humidifier can help congestion.  You can place a towel over your head and breathe in the steam from hot water coming from a faucet.  Avoid close contacts especially the very young and the elderly.  Cover your mouth when  you cough or sneeze.  Always remember to wash your hands.  Get Help Right Away If:  You develop worsening fever or sinus pain.  You develop a severe head ache or visual changes.  Your symptoms persist after you have completed your treatment plan.  Make sure you  Understand these instructions.  Will watch your condition.  Will get help right away if you are not doing well or get worse.  Your e-visit answers were reviewed by a board certified advanced clinical practitioner to complete your personal care plan.  Depending on the condition, your plan could have included both over the counter or prescription medications.  If there is a problem please reply  once you have received a response from your provider.  Your safety is important to us.  If you have drug allergies check your prescription carefully.    You can use MyChart to ask questions about today's visit, request a non-urgent call back, or ask for a work or school excuse for 24 hours related to this e-Visit. If it has been greater than 24 hours you will need to follow up with your provider, or enter a new e-Visit to address those concerns.  You will get an e-mail in the next two days asking about your experience.  I hope that your e-visit has been valuable and will speed your recovery. Thank you for using e-visits.

## 2019-07-17 ENCOUNTER — Other Ambulatory Visit: Payer: Self-pay | Admitting: *Deleted

## 2019-07-17 DIAGNOSIS — Z20822 Contact with and (suspected) exposure to covid-19: Secondary | ICD-10-CM

## 2019-07-18 LAB — NOVEL CORONAVIRUS, NAA: SARS-CoV-2, NAA: NOT DETECTED

## 2019-07-22 ENCOUNTER — Other Ambulatory Visit: Payer: Self-pay | Admitting: *Deleted

## 2019-07-22 DIAGNOSIS — Z20822 Contact with and (suspected) exposure to covid-19: Secondary | ICD-10-CM

## 2019-07-24 LAB — NOVEL CORONAVIRUS, NAA: SARS-CoV-2, NAA: NOT DETECTED

## 2019-09-23 ENCOUNTER — Other Ambulatory Visit: Payer: Self-pay

## 2019-09-23 DIAGNOSIS — Z20822 Contact with and (suspected) exposure to covid-19: Secondary | ICD-10-CM

## 2019-09-24 LAB — NOVEL CORONAVIRUS, NAA: SARS-CoV-2, NAA: DETECTED — AB

## 2019-09-25 ENCOUNTER — Encounter: Payer: Self-pay | Admitting: *Deleted

## 2019-10-01 ENCOUNTER — Other Ambulatory Visit: Payer: Self-pay

## 2019-10-01 ENCOUNTER — Ambulatory Visit: Payer: BC Managed Care – PPO | Attending: Internal Medicine

## 2019-10-01 DIAGNOSIS — Z20822 Contact with and (suspected) exposure to covid-19: Secondary | ICD-10-CM

## 2019-10-01 DIAGNOSIS — Z20828 Contact with and (suspected) exposure to other viral communicable diseases: Secondary | ICD-10-CM | POA: Insufficient documentation

## 2019-10-02 NOTE — Progress Notes (Signed)
Moving orders to this encounter.  

## 2019-10-02 NOTE — Progress Notes (Signed)
Order(s) created erroneously. Erroneous order ID: [id number]  Order moved by: Lafayette Dunlevy M  Order move date/time: 10/02/2019 12:48 PM  Source Patient: [id number]  Source Contact: 10/01/2019  Destination Patient: [id number]  Destination Contact: 10/01/2019 

## 2019-10-02 NOTE — Progress Notes (Signed)
Order(s) created erroneously. Erroneous order ID: [id number]  Order moved by: Brigitte Pulse  Order move date/time: 10/02/2019 12:48 PM  Source Patient: [id number]  Source Contact: 10/01/2019  Destination Patient: [id number]  Destination Contact: 10/01/2019

## 2019-10-03 LAB — NOVEL CORONAVIRUS, NAA: SARS-CoV-2, NAA: NOT DETECTED

## 2020-08-26 ENCOUNTER — Telehealth: Payer: Self-pay | Admitting: Physician Assistant

## 2020-08-26 ENCOUNTER — Other Ambulatory Visit: Payer: Self-pay | Admitting: Family

## 2020-08-26 DIAGNOSIS — J069 Acute upper respiratory infection, unspecified: Secondary | ICD-10-CM

## 2020-08-26 MED ORDER — FLUTICASONE PROPIONATE 50 MCG/ACT NA SUSP: 2.0000 | Freq: Every day | NASAL | 0 refills | Status: DC

## 2020-08-26 NOTE — Addendum Note (Signed)
Addended by: Sebastian Ache on: 08/26/2020 10:46 AM   Modules accepted: Orders

## 2020-08-26 NOTE — Progress Notes (Signed)
We are sorry you are not feeling well.  Here is how we plan to help! ° °Based on what you have shared with me, it looks like you may have a viral upper respiratory infection.  Upper respiratory infections are caused by a large number of viruses; however, rhinovirus is the most common cause.  ° °Symptoms vary from person to person, with common symptoms including sore throat, cough, fatigue or lack of energy and feeling of general discomfort.  A low-grade fever of up to 100.4 may present, but is often uncommon.  Symptoms vary however, and are closely related to a person's age or underlying illnesses.  The most common symptoms associated with an upper respiratory infection are nasal discharge or congestion, cough, sneezing, headache and pressure in the ears and face.  These symptoms usually persist for about 3 to 10 days, but can last up to 2 weeks.  It is important to know that upper respiratory infections do not cause serious illness or complications in most cases.   ° °Upper respiratory infections can be transmitted from person to person, with the most common method of transmission being a person's hands.  The virus is able to live on the skin and can infect other persons for up to 2 hours after direct contact.  Also, these can be transmitted when someone coughs or sneezes; thus, it is important to cover the mouth to reduce this risk.  To keep the spread of the illness at bay, good hand hygiene is very important. ° °This is an infection that is most likely caused by a virus. There are no specific treatments other than to help you with the symptoms until the infection runs its course.  We are sorry you are not feeling well.  Here is how we plan to help! ° ° °For nasal congestion, you may use an oral decongestants such as Mucinex D or if you have glaucoma or high blood pressure use plain Mucinex.  Saline nasal spray or nasal drops can help and can safely be used as often as needed for congestion.  For your congestion,  I have prescribed Fluticasone nasal spray one spray in each nostril twice a day ° °If you do not have a history of heart disease, hypertension, diabetes or thyroid disease, prostate/bladder issues or glaucoma, you may also use Sudafed to treat nasal congestion.  It is highly recommended that you consult with a pharmacist or your primary care physician to ensure this medication is safe for you to take.    ° °If you have a cough, you may use cough suppressants such as Delsym and Robitussin.  If you have glaucoma or high blood pressure, you can also use Coricidin HBP.   °For cough I have prescribed for you A prescription cough medication called Tessalon Perles 100 mg. You may take 1-2 capsules every 8 hours as needed for cough ° °If you have a sore or scratchy throat, use a saltwater gargle- ¼ to ½ teaspoon of salt dissolved in a 4-ounce to 8-ounce glass of warm water.  Gargle the solution for approximately 15-30 seconds and then spit.  It is important not to swallow the solution.  You can also use throat lozenges/cough drops and Chloraseptic spray to help with throat pain or discomfort.  Warm or cold liquids can also be helpful in relieving throat pain. ° °For headache, pain or general discomfort, you can use Ibuprofen or Tylenol as directed.   °Some authorities believe that zinc sprays or the use of   Echinacea may shorten the course of your symptoms. ° ° °HOME CARE °• Only take medications as instructed by your medical team. °• Be sure to drink plenty of fluids. Water is fine as well as fruit juices, sodas and electrolyte beverages. You may want to stay away from caffeine or alcohol. If you are nauseated, try taking small sips of liquids. How do you know if you are getting enough fluid? Your urine should be a pale yellow or almost colorless. °• Get rest. °• Taking a steamy shower or using a humidifier may help nasal congestion and ease sore throat pain. You can place a towel over your head and breathe in the steam  from hot water coming from a faucet. °• Using a saline nasal spray works much the same way. °• Cough drops, hard candies and sore throat lozenges may ease your cough. °• Avoid close contacts especially the very young and the elderly °• Cover your mouth if you cough or sneeze °• Always remember to wash your hands.  ° °GET HELP RIGHT AWAY IF: °• You develop worsening fever. °• If your symptoms do not improve within 10 days °• You develop yellow or green discharge from your nose over 3 days. °• You have coughing fits °• You develop a severe head ache or visual changes. °• You develop shortness of breath, difficulty breathing or start having chest pain °• Your symptoms persist after you have completed your treatment plan ° °MAKE SURE YOU  °· Understand these instructions. °· Will watch your condition. °· Will get help right away if you are not doing well or get worse. ° °Your e-visit answers were reviewed by a board certified advanced clinical practitioner to complete your personal care plan. Depending upon the condition, your plan could have included both over the counter or prescription medications. °Please review your pharmacy choice. If there is a problem, you may call our nursing hot line at and have the prescription routed to another pharmacy. °Your safety is important to us. If you have drug allergies check your prescription carefully.  ° °You can use MyChart to ask questions about today’s visit, request a non-urgent call back, or ask for a work or school excuse for 24 hours related to this e-Visit. If it has been greater than 24 hours you will need to follow up with your provider, or enter a new e-Visit to address those concerns. °You will get an e-mail in the next two days asking about your experience.  I hope that your e-visit has been valuable and will speed your recovery. Thank you for using e-visits. ° ° ° ° °Greater than 5 minutes, yet less than 10 minutes of time have been spent researching, coordinating  and implementing care for this patient today.  ° °

## 2021-04-21 ENCOUNTER — Other Ambulatory Visit: Payer: Self-pay | Admitting: Orthopaedic Surgery

## 2021-04-21 DIAGNOSIS — M25511 Pain in right shoulder: Secondary | ICD-10-CM

## 2021-04-26 ENCOUNTER — Ambulatory Visit
Admission: RE | Admit: 2021-04-26 | Discharge: 2021-04-26 | Disposition: A | Discharge status: Home / Self Care | Payer: BC Managed Care – PPO | Source: Ambulatory Visit | Attending: Orthopaedic Surgery | Admitting: Orthopaedic Surgery

## 2021-04-26 ENCOUNTER — Other Ambulatory Visit: Payer: Self-pay

## 2021-04-26 DIAGNOSIS — M25511 Pain in right shoulder: Secondary | ICD-10-CM

## 2021-05-11 ENCOUNTER — Encounter (HOSPITAL_BASED_OUTPATIENT_CLINIC_OR_DEPARTMENT_OTHER): Payer: Self-pay | Admitting: Orthopaedic Surgery

## 2021-05-11 ENCOUNTER — Other Ambulatory Visit: Payer: Self-pay

## 2021-05-18 ENCOUNTER — Ambulatory Visit (HOSPITAL_BASED_OUTPATIENT_CLINIC_OR_DEPARTMENT_OTHER)
Admission: RE | Admit: 2021-05-18 | Payer: BC Managed Care – PPO | Source: Home / Self Care | Admitting: Orthopaedic Surgery

## 2021-05-18 SURGERY — REPAIR, SHOULDER, LATARJET
Anesthesia: Choice | Site: Shoulder | Laterality: Right

## 2021-06-28 ENCOUNTER — Telehealth: Payer: BC Managed Care – PPO | Admitting: Physician Assistant

## 2021-06-28 DIAGNOSIS — J020 Streptococcal pharyngitis: Secondary | ICD-10-CM

## 2021-06-28 MED ORDER — AMOXICILLIN 500 MG PO CAPS: 500.0000 mg | ORAL_CAPSULE | Freq: Two times a day (BID) | ORAL | 0 refills | Status: AC

## 2021-06-28 NOTE — Progress Notes (Signed)
E-Visit for Sore Throat - Strep Symptoms  We are sorry that you are not feeling well.  Here is how we plan to help!  Based on what you have shared with me it is likely that you have strep pharyngitis.  Strep pharyngitis is inflammation and infection in the back of the throat.  This is an infection cause by bacteria and is treated with antibiotics.  I have prescribed Amoxicillin 500 mg twice a day for 10 days. For throat pain, we recommend over the counter oral pain relief medications such as acetaminophen or aspirin, or anti-inflammatory medications such as ibuprofen or naproxen sodium. Topical treatments such as oral throat lozenges or sprays may be used as needed. Strep infections are not as easily transmitted as other respiratory infections, however we still recommend that you avoid close contact with loved ones, especially the very young and elderly.  Remember to wash your hands thoroughly throughout the day as this is the number one way to prevent the spread of infection and wipe down door knobs and counters with disinfectant.  You are considered contagious until you have been on antibiotic for 24 hours so if you return to work tomorrow with permission from the school, you need to mask consistently and keep hands washed to avoid spread. It is, however, recommended for you to stay home until you have had 24 hours of medication.    Home Care: Only take medications as instructed by your medical team. Complete the entire course of an antibiotic. Do not take these medications with alcohol. A steam or ultrasonic humidifier can help congestion.  You can place a towel over your head and breathe in the steam from hot water coming from a faucet. Avoid close contacts especially the very young and the elderly. Cover your mouth when you cough or sneeze. Always remember to wash your hands.  Get Help Right Away If: You develop worsening fever or sinus pain. You develop a severe head ache or visual  changes. Your symptoms persist after you have completed your treatment plan.  Make sure you Understand these instructions. Will watch your condition. Will get help right away if you are not doing well or get worse.   Thank you for choosing an e-visit.  Your e-visit answers were reviewed by a board certified advanced clinical practitioner to complete your personal care plan. Depending upon the condition, your plan could have included both over the counter or prescription medications.  Please review your pharmacy choice. Make sure the pharmacy is open so you can pick up prescription now. If there is a problem, you may contact your provider through [employer] and have the prescription routed to another pharmacy.  Your safety is important to [place]. If you have drug allergies check your prescription carefully.   For the next 24 hours you can use MyChart to ask questions about today's visit, request a non-urgent call back, or ask for a work or school excuse. You will get an email in the next two days asking about your experience. I hope that your e-visit has been valuable and will speed your recovery.

## 2021-06-28 NOTE — Progress Notes (Signed)
I have spent 5 minutes in review of e-visit questionnaire, review and updating patient chart, medical decision making and response to patient.   Kosisochukwu Goldberg Cody Jordin Vicencio, PA-C    

## 2021-08-04 ENCOUNTER — Telehealth: Payer: BC Managed Care – PPO | Admitting: Physician Assistant

## 2021-08-04 DIAGNOSIS — B9789 Other viral agents as the cause of diseases classified elsewhere: Secondary | ICD-10-CM | POA: Diagnosis not present

## 2021-08-04 DIAGNOSIS — J019 Acute sinusitis, unspecified: Secondary | ICD-10-CM

## 2021-08-04 MED ORDER — FLUTICASONE PROPIONATE 50 MCG/ACT NA SUSP: 2.0000 | Freq: Every day | NASAL | 0 refills | Status: AC

## 2021-08-04 NOTE — Progress Notes (Signed)
E-Visit for Sinus Problems  We are sorry that you are not feeling well.  Here is how we plan to help!  Based on what you have shared with me it looks like you have sinusitis.  Sinusitis is inflammation and infection in the sinus cavities of the head.  Based on your presentation I believe you most likely have Acute Viral Sinusitis.This is an infection most likely caused by a virus. There is not specific treatment for viral sinusitis other than to help you with the symptoms until the infection runs its course.  You may use an oral decongestant such as Mucinex D or if you have glaucoma or high blood pressure use plain Mucinex. Saline nasal spray help and can safely be used as often as needed for congestion, I have prescribed: Fluticasone nasal spray two sprays in each nostril once a day  Some authorities believe that zinc sprays or the use of Echinacea may shorten the course of your symptoms.  Sinus infections are not as easily transmitted as other respiratory infection, however we still recommend that you avoid close contact with loved ones, especially the very young and elderly.  Remember to wash your hands thoroughly throughout the day as this is the number one way to prevent the spread of infection!  Home Care: Only take medications as instructed by your medical team. Do not take these medications with alcohol. A steam or ultrasonic humidifier can help congestion.  You can place a towel over your head and breathe in the steam from hot water coming from a faucet. Avoid close contacts especially the very young and the elderly. Cover your mouth when you cough or sneeze. Always remember to wash your hands.  Get Help Right Away If: You develop worsening fever or sinus pain. You develop a severe head ache or visual changes. Your symptoms persist after you have completed your treatment plan.  Make sure you Understand these instructions. Will watch your condition. Will get help right away if you  are not doing well or get worse.   Thank you for choosing an e-visit.  Your e-visit answers were reviewed by a board certified advanced clinical practitioner to complete your personal care plan. Depending upon the condition, your plan could have included both over the counter or prescription medications.  Please review your pharmacy choice. Make sure the pharmacy is open so you can pick up prescription now. If there is a problem, you may contact your provider through MyChart messaging and have the prescription routed to another pharmacy.  Your safety is important to us. If you have drug allergies check your prescription carefully.   For the next 24 hours you can use MyChart to ask questions about today's visit, request a non-urgent call back, or ask for a work or school excuse. You will get an email in the next two days asking about your experience. I hope that your e-visit has been valuable and will speed your recovery.  I provided 5 minutes of non face-to-face time during this encounter for chart review and documentation.   

## 2022-01-03 IMAGING — CT CT SHOULDER*R* W/O CM
1 series · 12 of 14 positions shown, 15 images · non-contrast
Comparison: MRI 04/13/2021

CLINICAL DATA: History of right shoulder dislocation

EXAM:
CT OF THE UPPER RIGHT EXTREMITY WITHOUT CONTRAST
TECHNIQUE: Multidetector CT imaging of the upper right extremity was performed
according to the standard protocol.

[Series 3: soft tissue · axial · 0.55mm/px · z∈[-203,-45]mm · 12 of 95 slices shown, 15 images]
[im 8/95  soft-tissue]
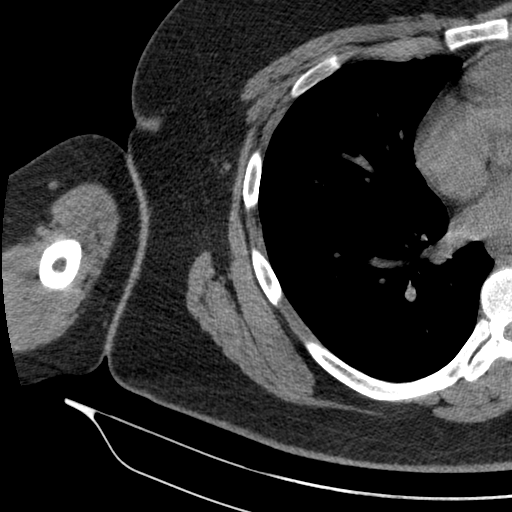
[im 8/95  bone]
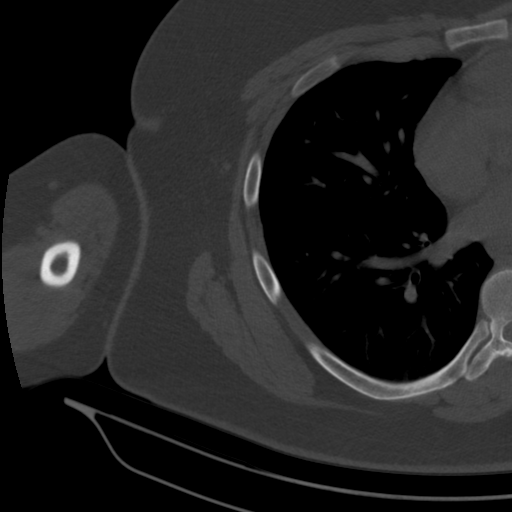
[im 15/95  bone]
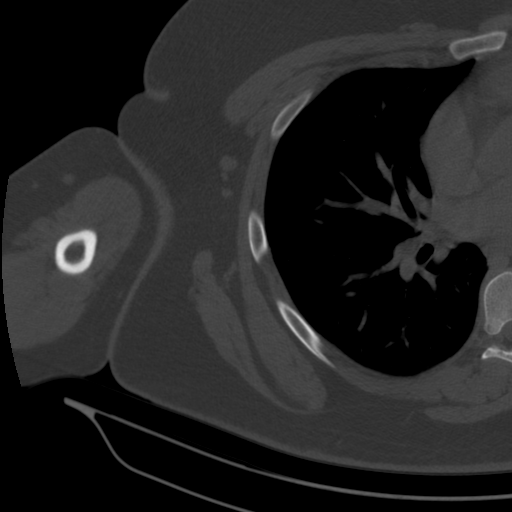
[im 22/95  bone]
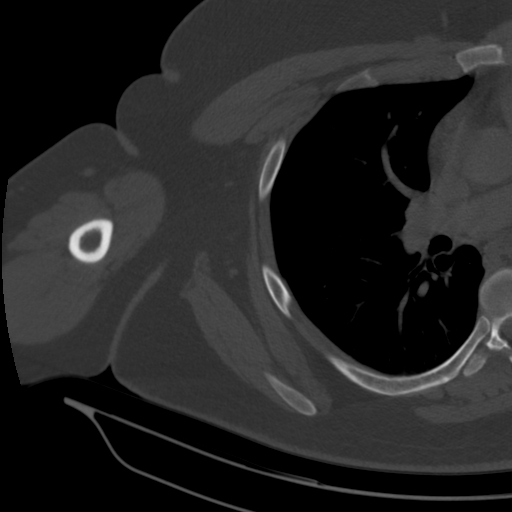
[im 29/95  bone]
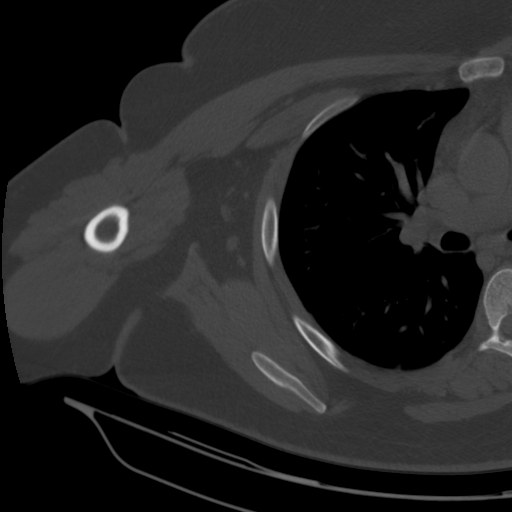
[im 37/95  soft-tissue]
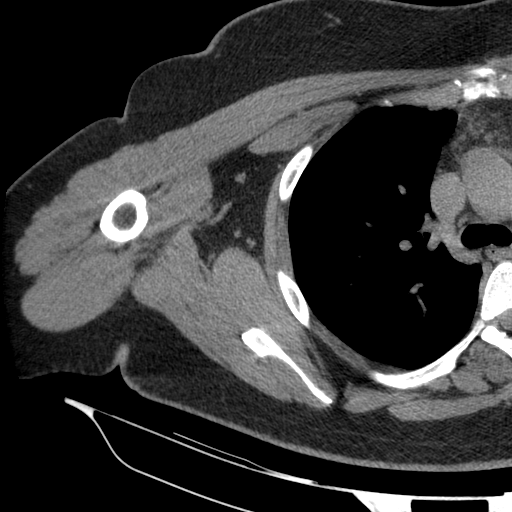
[im 37/95  bone]
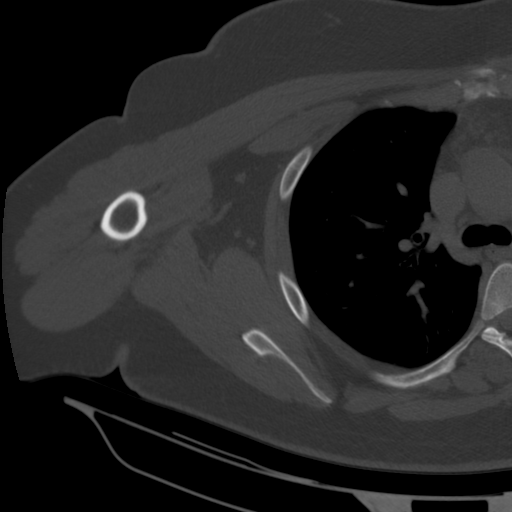
[im 44/95  bone]
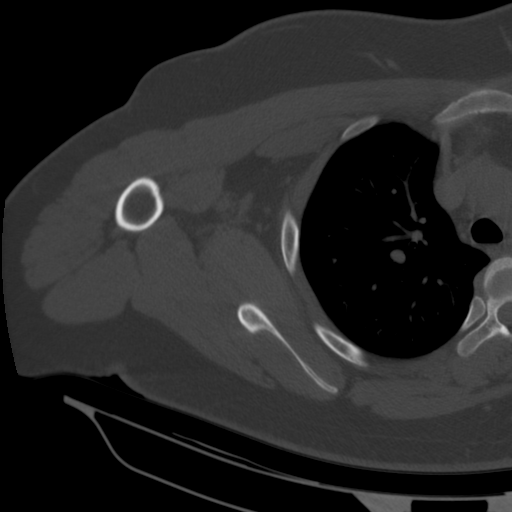
[im 51/95  bone]
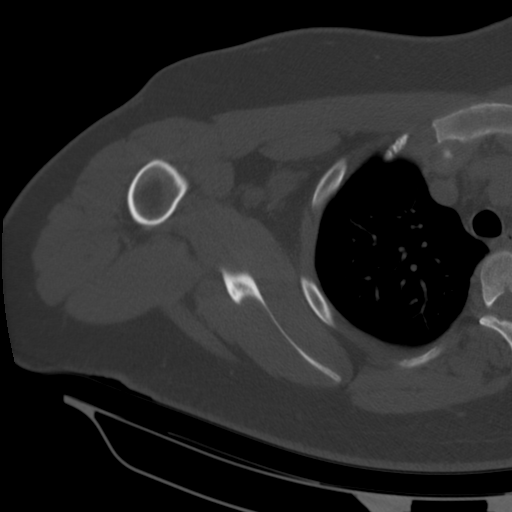
[im 58/95  bone]
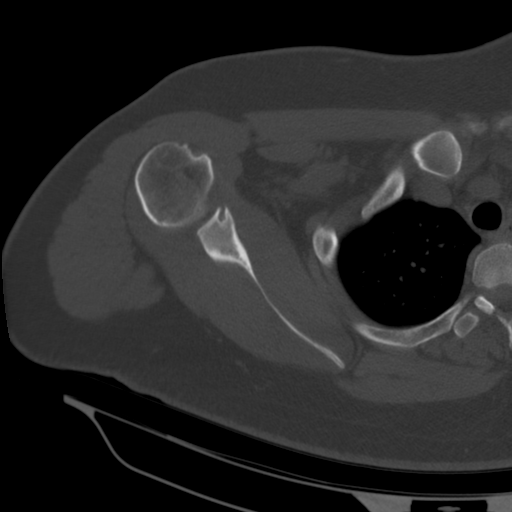
[im 66/95  soft-tissue]
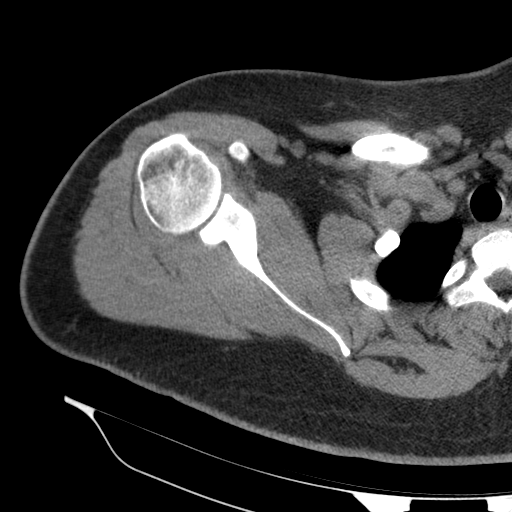
[im 66/95  bone]
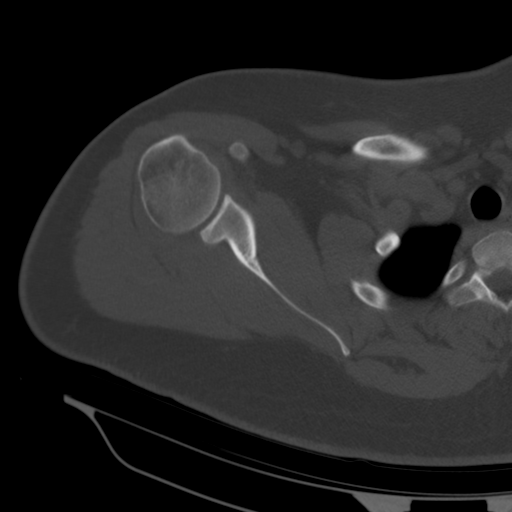
[im 73/95  bone]
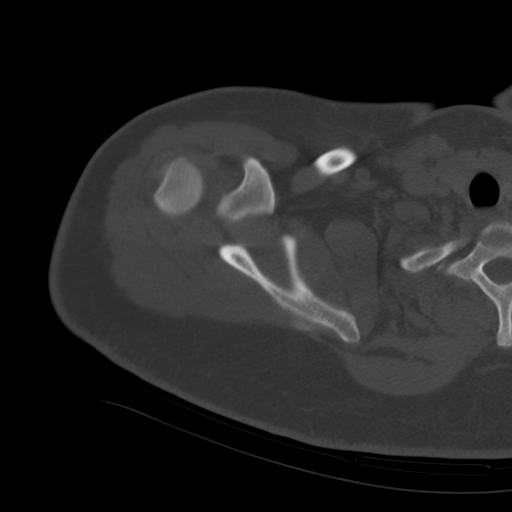
[im 80/95  bone]
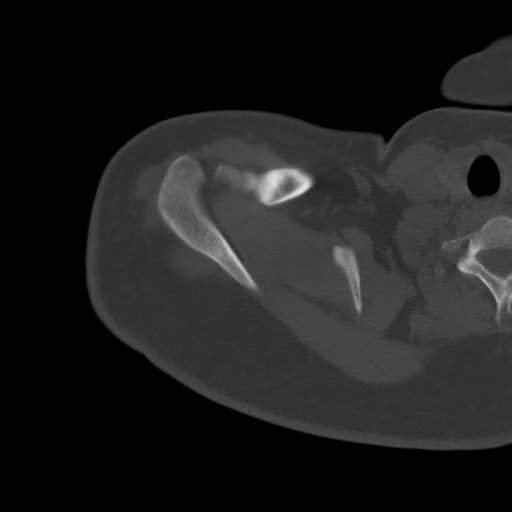
[im 87/95  bone]
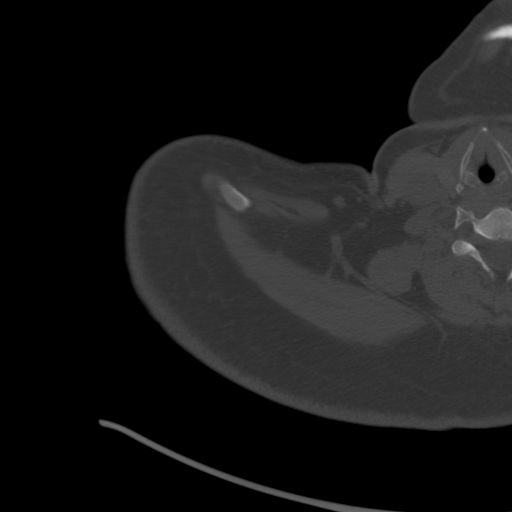

[12 of 14 positions shown; findings below may reference images not displayed]

FINDINGS: Bones/Joint/Cartilage

Glenohumeral joint is aligned without dislocation. Small contour
deformity of the superolateral humeral head compatible with prior
Hill-Sachs impaction fracture. Postsurgical changes to the anterior
inferior glenoid from prior labral repair. No acute fracture is
seen. Glenohumeral and acromioclavicular joint spaces are well
maintained without significant arthropathy. No joint effusion is
seen. No subacromial-subdeltoid bursal fluid. Note suspicious bone
lesion.

Ligaments

Suboptimally assessed by CT.

Muscles and Tendons

Preserved muscle bulk. Rotator cuff tendons appear grossly intact by
CT.

Soft tissues

No soft tissue edema or fluid collection. No right axillary
lymphadenopathy. The visualized right lung field is clear.
IMPRESSION: 1. No acute osseous abnormality of the right shoulder.
2. Small Hill-Sachs deformity.
3. Postsurgical changes to the anteroinferior glenoid from prior
labral repair.

## 2022-03-28 ENCOUNTER — Telehealth: Payer: BC Managed Care – PPO | Admitting: Physician Assistant

## 2022-03-28 NOTE — Progress Notes (Addendum)
Michael Conley,  I see you have listed a [place] pharmacy for today's visit. We cannot treat you if you are not in [place] nor can we send in scripts outside of these states via e-visit. You will need to either be seen locally for evaluation or reach out to your PCP to see if they are able to assist you.

## 2022-04-10 ENCOUNTER — Encounter (HOSPITAL_COMMUNITY): Payer: Self-pay

## 2022-04-10 ENCOUNTER — Ambulatory Visit (HOSPITAL_COMMUNITY): Payer: BC Managed Care – PPO | Attending: Orthopaedic Surgery

## 2022-04-10 DIAGNOSIS — R29898 Other symptoms and signs involving the musculoskeletal system: Secondary | ICD-10-CM | POA: Diagnosis present

## 2022-04-10 DIAGNOSIS — M25511 Pain in right shoulder: Secondary | ICD-10-CM | POA: Insufficient documentation

## 2022-04-10 DIAGNOSIS — M25611 Stiffness of right shoulder, not elsewhere classified: Secondary | ICD-10-CM | POA: Diagnosis present

## 2022-04-10 DIAGNOSIS — R278 Other lack of coordination: Secondary | ICD-10-CM | POA: Insufficient documentation

## 2022-04-12 ENCOUNTER — Encounter (HOSPITAL_COMMUNITY): Payer: Self-pay

## 2022-04-12 ENCOUNTER — Ambulatory Visit (HOSPITAL_COMMUNITY): Payer: BC Managed Care – PPO

## 2022-04-12 DIAGNOSIS — M25511 Pain in right shoulder: Secondary | ICD-10-CM

## 2022-04-12 DIAGNOSIS — R278 Other lack of coordination: Secondary | ICD-10-CM

## 2022-04-12 DIAGNOSIS — R29898 Other symptoms and signs involving the musculoskeletal system: Secondary | ICD-10-CM | POA: Diagnosis not present

## 2022-04-12 DIAGNOSIS — M25611 Stiffness of right shoulder, not elsewhere classified: Secondary | ICD-10-CM

## 2022-04-12 NOTE — Therapy (Signed)
OUTPATIENT OCCUPATIONAL THERAPY TREATMENT NOTE   Patient Name: Michael Conley MRN: [id number] DOB:December 26, 1995, 26 y.o., male Today's Date: 04/12/2022    PCP: Elfredia Nevins, MD  REFERRING PROVIDER: Ramond Marrow, MD  END OF SESSION:   OT End of Session - 04/12/22 1435     Visit Number 2    Number of Visits 20    Date for OT Re-Evaluation 06/09/22   mini reassessment 05/08/22   Authorization Type [employer]    Authorization Time Period No Auth Required    OT Start Time 1433    OT Stop Time 1514    OT Time Calculation (min) 41 min    Activity Tolerance Patient tolerated treatment well    Behavior During Therapy WFL for tasks assessed/performed             Past Medical History:  Diagnosis Date   Dislocated shoulder 12/15/2014   right   Past Surgical History:  Procedure Laterality Date   ORCHIOPEXY     TYMPANOSTOMY TUBE PLACEMENT     WISDOM TOOTH EXTRACTION     Patient Active Problem List   Diagnosis Date Noted   Dislocation of shoulder, anterior, right, closed 09/05/2013    ONSET DATE: 04/03/22  REFERRING DIAG: Right Latarjet Coracoid Bone Transfer  THERAPY DIAG:  Other symptoms and signs involving the musculoskeletal system  Other lack of coordination  Acute pain of right shoulder  Stiffness of right shoulder, not elsewhere classified  Rationale for Evaluation and Treatment Rehabilitation  PERTINENT HISTORY:  Pt is a 27 year old male who presents s/p right Latarjet Coracoid Bone Transfer completed 04/03/22. He has a history of shoulder dislocations and a right shoulder [employer] reconstruction 01/01/15. Dr. Everardo Pacific referred pt to occupational therapy for evaluation and treatment.  Pt presents to evaluation with [employer] abduction sling.   PRECAUTIONS: Shoulder; see protocol  WEIGHT BEARING RESTRICTIONS Yes NWB  SUBJECTIVE: My doctor was happy with how I'm doing and doesn't have any concerns with nerves. I am not really in much pain anymore and I've been  taking in out of the sling to rest while sitting around.  PAIN:  Are you having pain? Yes: NPRS scale: 1/10 Pain location: incision sites and bicep Pain description: dull, ache Aggravating factors: Movement  Relieving factors: ice  5/10 pain with activity    OBJECTIVE:  HAND DOMINANCE: Right     FUNCTIONAL OUTCOME MEASURES: Quick Dash: 65.91   UPPER EXTREMITY ROM      Tested with pt in supine, er/IR adducted  Passive ROM Right eval  Shoulder flexion 85  Shoulder abduction 55  Shoulder internal rotation 90  Shoulder external rotation 0  (Blank rows = not tested)     UPPER EXTREMITY MMT:      Not tested due to protocol  MMT Right eval  Shoulder flexion    Shoulder abduction    Shoulder internal rotation    Shoulder external rotation    (Blank rows = not tested)     PATIENT EDUCATION: Education details: Discussed protocol and healing timeline, sling use, table slides, and plan of care Person educated: Patient Education method: Explanation, Demonstration, and Handouts Education comprehension: verbalized understanding and returned demonstration     HOME EXERCISE PROGRAM: Eval: Table Slides   GOALS: Goals reviewed with patient? Yes   SHORT TERM GOALS: Target date: 05/08/22   Pt will be provided with and educated on HEP to improve mobility in RUE required for ADL completion.  Goal status: INITIAL  2.  Pt will decrease pain with movement to 4/10 or less in order to sleep for 4+ consecutive hours without waking due to pain. Goal status: INITIAL   3.  Pt will increase P/ROM in RUE to [facility] to improve ability to perform dressing tasks with minimal compensatory strategies. Goal status: INITIAL   4.  Pt will increase strength in RUE to 3/5 to improve ability to complete ADLs at waist or head height.  Goal status: INITIAL       LONG TERM GOALS: Target date:  06/05/22      Pt will decrease pain with movement to 1/10 or less in order to sleep through the night  without waking due to pain.  Goal status: INITIAL   2.  Pt will decreased RUE fascial restrictions to minimal amounts or less to improve mobility required for overhead reaching tasks.  Goal status: INITIAL   3.  Pt will increase A/ROM of RUE to [facility] to improve ability to reach overhead and behind back during dressing and bathing tasks.  Goal status: INITIAL   4.  Pt will increase strength in RUE to 4+/5 to improve ability to perform lifting tasks required for work around the house.  Goal status: INITIAL   TODAY'S TREATMENT:  04/12/22 -Manual Therapy: Soft tissue mobilization and myofascial release completed to upper quadrant to address restrictions  -P/ROM: Pt in supine, 1x10 each shoulder flexion, abduction, IR/er -Scapular ROM: seated, 1x10 retraction, extension, elevation/depression  -Ball slides: seated, 1x10 each abduction and forward flexion  -Isometrics 5x5" flexion, extension, abduction, 25% effort  ASSESSMENT:   CLINICAL IMPRESSION: A:Fascial restrictions most notable along the length of the biceps and upper trapezius. Therapist completing P/ROM to pt tolerance, about 75% shoulder flexion but limited, 25%, shoulder abduction with mild end range pain reported. Continues to be limited with external rotation although able to be passively stretched past neutral today. Therapist providing moderate tactile cues for scapular ROM activities to ensure proper muscle recruitment. Pt reporting no increased pain with isometrics today but some "discomfort" with ball slides.    PLAN:   OT FREQUENCY: 3x/week for 4 weeks, 2x/week for 4 weeks  OT DURATION: 8 weeks  PLANNED INTERVENTIONS: self care/ADL training, therapeutic exercise, therapeutic activity, manual therapy, scar mobilization, passive range of motion, ultrasound, moist heat, cryotherapy, patient/family education, DME and/or AE instructions, and Re-evaluation  RECOMMENDED OTHER SERVICES: Pt will benefit from skilled OT services to  improve ROM and strength and decreased pain and fascial restrictions in order to return to prior level of independence with all functional activities.   CONSULTED AND AGREED WITH PLAN OF CARE: Patient  PLAN FOR NEXT SESSION: P: Add scapular ROM to [employer], OTD, OTR/L [phone]  04/12/2022, 4:19 PM

## 2022-04-14 ENCOUNTER — Encounter (HOSPITAL_COMMUNITY): Payer: Self-pay

## 2022-04-14 ENCOUNTER — Ambulatory Visit (HOSPITAL_COMMUNITY): Payer: BC Managed Care – PPO

## 2022-04-14 DIAGNOSIS — R29898 Other symptoms and signs involving the musculoskeletal system: Secondary | ICD-10-CM

## 2022-04-14 DIAGNOSIS — M25511 Pain in right shoulder: Secondary | ICD-10-CM

## 2022-04-14 DIAGNOSIS — R278 Other lack of coordination: Secondary | ICD-10-CM

## 2022-04-14 DIAGNOSIS — M25611 Stiffness of right shoulder, not elsewhere classified: Secondary | ICD-10-CM

## 2022-04-14 NOTE — Therapy (Signed)
OUTPATIENT OCCUPATIONAL THERAPY TREATMENT NOTE   Patient Name: Michael Conley MRN: [id number] DOB:[birth year], 26 y.o., male Today's Date: 04/14/2022    PCP: Elfredia Nevins, MD  REFERRING PROVIDER: Ramond Marrow, MD  END OF SESSION:   OT End of Session - 04/14/22 1128     Visit Number 3    Number of Visits 20    Date for OT Re-Evaluation 06/09/22   mini reassessment 05/08/22   Authorization Type [employer]    Authorization Time Period No Auth Required    OT Start Time 1125    OT Stop Time 1200    OT Time Calculation (min) 35 min    Activity Tolerance Patient tolerated treatment well    Behavior During Therapy WFL for tasks assessed/performed             Past Medical History:  Diagnosis Date   Dislocated shoulder 12/15/2014   right   Past Surgical History:  Procedure Laterality Date   ORCHIOPEXY     TYMPANOSTOMY TUBE PLACEMENT     WISDOM TOOTH EXTRACTION     Patient Active Problem List   Diagnosis Date Noted   Dislocation of shoulder, anterior, right, closed 09/05/2013    ONSET DATE: 04/03/22  REFERRING DIAG: Right Latarjet Coracoid Bone Transfer  THERAPY DIAG:  Other symptoms and signs involving the musculoskeletal system  Other lack of coordination  Acute pain of right shoulder  Stiffness of right shoulder, not elsewhere classified  Rationale for Evaluation and Treatment Rehabilitation  PERTINENT HISTORY:  Pt is a 26 year old male who presents s/p right Latarjet Coracoid Bone Transfer completed 04/03/22. He has a history of shoulder dislocations and a right shoulder Bankart reconstruction 01/01/15. Dr. Everardo Pacific referred pt to occupational therapy for evaluation and treatment.  Pt presents to evaluation with DonJoy abduction sling.   PRECAUTIONS: Shoulder; see protocol  WEIGHT BEARING RESTRICTIONS Yes NWB  SUBJECTIVE: I put my deodorant on the normal way today, big milestone. Last night I got dressed and showered all by myself and I was sore after.    PAIN:  Are you having pain? Yes: NPRS scale: 1/10 Pain location: incision sites and bicep Pain description: Sore Aggravating factors: Movement  Relieving factors: ice  3 or 4/10 pain with activity    OBJECTIVE:  HAND DOMINANCE: Right     FUNCTIONAL OUTCOME MEASURES: Quick Dash: 65.91   UPPER EXTREMITY ROM      Tested with pt in supine, er/IR adducted  Passive ROM Right eval  Shoulder flexion 85  Shoulder abduction 55  Shoulder internal rotation 90  Shoulder external rotation 0  (Blank rows = not tested)     UPPER EXTREMITY MMT:      Not tested due to protocol  MMT Right eval  Shoulder flexion    Shoulder abduction    Shoulder internal rotation    Shoulder external rotation    (Blank rows = not tested)     PATIENT EDUCATION: Education details: Shoulder isometrics and scapular AROM, ice for swelling Person educated: Patient Education method: Programmer, multimedia, Demonstration, and Handouts Education comprehension: verbalized understanding and returned demonstration     HOME EXERCISE PROGRAM: Eval: Table Slides; 6/30: shoulder isometrics (25% force), seated scapular AROM   GOALS: Goals reviewed with patient? Yes   SHORT TERM GOALS: Target date: 05/08/22   Pt will be provided with and educated on HEP to improve mobility in RUE required for ADL completion.  Goal status: INITIAL   2.  Pt will decrease pain  with movement to 4/10 or less in order to sleep for 4+ consecutive hours without waking due to pain. Goal status: INITIAL   3.  Pt will increase P/ROM in RUE to [facility] to improve ability to perform dressing tasks with minimal compensatory strategies. Goal status: INITIAL   4.  Pt will increase strength in RUE to 3/5 to improve ability to complete ADLs at waist or head height.  Goal status: INITIAL       LONG TERM GOALS: Target date:  06/05/22      Pt will decrease pain with movement to 1/10 or less in order to sleep through the night without waking due to  pain.  Goal status: INITIAL   2.  Pt will decreased RUE fascial restrictions to minimal amounts or less to improve mobility required for overhead reaching tasks.  Goal status: INITIAL   3.  Pt will increase A/ROM of RUE to [facility] to improve ability to reach overhead and behind back during dressing and bathing tasks.  Goal status: INITIAL   4.  Pt will increase strength in RUE to 4+/5 to improve ability to perform lifting tasks required for work around the house.  Goal status: INITIAL   TODAY'S TREATMENT:  04/14/22 -Manual Therapy: Soft tissue mobilization and myofascial release completed to upper quadrant to address restrictions. Emphasis on upper trapezius and posterior shoulder -P/ROM: Pt in supine, 1x10 each shoulder flexion, abduction, IR/er -Isometrics 5x5" flexion, extension, abduction, 25% effort -Scapular AROM: seated, 2x10 retraction, extension, elevation/depression    04/12/22 -Manual Therapy: Soft tissue mobilization and myofascial release completed to upper quadrant to address restrictions  -P/ROM: Pt in supine, 1x10 each shoulder flexion, abduction, IR/er -Scapular ROM: seated, 1x10 retraction, extension, elevation/depression  -Ball slides: seated, 1x10 each abduction and forward flexion  -Isometrics 5x5" flexion, extension, abduction, 25% effort  ASSESSMENT:   CLINICAL IMPRESSION: A: Noted decreased fascial restrictions along then length of the biceps today, although significant restrictions continue throughout upper trapezius and posterior shoulder. Pt continues with swelling around incision site. P/ROM completed to tolerance, slight increase noted with abduction and external rotation today. Therapist instructing pt to complete deep breathing throughout stretch with pt reporting benefit. Pt tolerating addition to HEP, demonstrating good form and understanding of mechanics. Discussed importance of ice for swelling.    PLAN:   OT FREQUENCY: 3x/week for 4 weeks, 2x/week  for 4 weeks  OT DURATION: 8 weeks  PLANNED INTERVENTIONS: self care/ADL training, therapeutic exercise, therapeutic activity, manual therapy, scar mobilization, passive range of motion, ultrasound, moist heat, cryotherapy, patient/family education, DME and/or AE instructions, and Re-evaluation  RECOMMENDED OTHER SERVICES: Pt will benefit from skilled OT services to improve ROM and strength and decreased pain and fascial restrictions in order to return to prior level of independence with all functional activities.   CONSULTED AND AGREED WITH PLAN OF CARE: Patient  PLAN FOR NEXT SESSION: P: Continue to progress with protocol      Perley Jain, OTD, OTR/L [phone]  04/14/2022, 12:10 PM

## 2022-04-14 NOTE — Patient Instructions (Signed)
Shoulder Isometric Exercises  Complete 5x each, holding for 5 seconds. Complete 2-3x/day.   1) Shoulder Flexion Standing up, place a towel between your arm/wrist and the wall. Press your arm forward into the wall gently, without moving your shoulder.     2) Shoulder Extension Standing with your back against a wall. Bend elbow to 90 degrees and place a towel between your bent elbow and the wall.  Press your elbow back into the wall gently, without moving your shoulder.      3) Shoulder Abduction Standing to the side of a wall, place a towel between your arm and the wall.  Press your arm into the wall gently.  Do not move your shoulder.       1) Seated Row   Sit up straight with elbows by your sides. Pull back with shoulders/elbows, keeping forearms straight, as if pulling back on the reins of a horse. Squeeze shoulder blades together. Repeat _10-15__times, __2-3__sets/day    2) Shoulder Elevation    Sit up straight with arms by your sides. Slowly bring your shoulders up towards your ears. Repeat_10-15__times, __2-3__ sets/day    3) Shoulder Extension    Sit up straight with both arms by your side, draw your arms back behind your waist. Keep your elbows straight. Repeat __10-15__times, __2-3__sets/day.

## 2022-04-24 ENCOUNTER — Ambulatory Visit (HOSPITAL_COMMUNITY): Payer: Self-pay | Attending: Orthopaedic Surgery

## 2022-04-24 ENCOUNTER — Encounter (HOSPITAL_COMMUNITY): Payer: Self-pay

## 2022-04-24 DIAGNOSIS — M25511 Pain in right shoulder: Secondary | ICD-10-CM | POA: Insufficient documentation

## 2022-04-24 DIAGNOSIS — M25611 Stiffness of right shoulder, not elsewhere classified: Secondary | ICD-10-CM | POA: Insufficient documentation

## 2022-04-24 DIAGNOSIS — R278 Other lack of coordination: Secondary | ICD-10-CM | POA: Insufficient documentation

## 2022-04-24 DIAGNOSIS — R29898 Other symptoms and signs involving the musculoskeletal system: Secondary | ICD-10-CM | POA: Insufficient documentation

## 2022-04-24 NOTE — Therapy (Addendum)
OUTPATIENT OCCUPATIONAL THERAPY TREATMENT NOTE   Patient Name: Michael Conley MRN: [id number] DOB:[birth year], 26 y.o., male Today's Date: 04/24/2022    PCP: Elfredia Nevins, MD  REFERRING PROVIDER: Ramond Marrow, MD  END OF SESSION:    04/24/22 1310  OT Visits / Re-Eval  Visit Number 4  Number of Visits 20  Date for OT Re-Evaluation 06/09/22 (mini reassessment 05/08/22)  Authorization  Authorization Type [employer]  Authorization Time Period No Auth Required  OT Time Calculation  OT Start Time 1306  OT Stop Time 1345  OT Time Calculation (min) 39 min  End of Session  Activity Tolerance Patient tolerated treatment well  Behavior During Therapy WFL for tasks assessed/performed    Past Medical History:  Diagnosis Date   Dislocated shoulder 12/15/2014   right   Past Surgical History:  Procedure Laterality Date   ORCHIOPEXY     TYMPANOSTOMY TUBE PLACEMENT     WISDOM TOOTH EXTRACTION     Patient Active Problem List   Diagnosis Date Noted   Dislocation of shoulder, anterior, right, closed 09/05/2013    ONSET DATE: 04/03/22  REFERRING DIAG: Right Latarjet Coracoid Bone Transfer  THERAPY DIAG:  Other symptoms and signs involving the musculoskeletal system  Other lack of coordination  Acute pain of right shoulder  Stiffness of right shoulder, not elsewhere classified  Rationale for Evaluation and Treatment Rehabilitation  PERTINENT HISTORY:  Pt is a 26 year old male who presents s/p right Latarjet Coracoid Bone Transfer completed 04/03/22. He has a history of shoulder dislocations and a right shoulder Bankart reconstruction 01/01/15. Dr. Everardo Pacific referred pt to occupational therapy for evaluation and treatment.  Pt presents to evaluation with DonJoy abduction sling.   PRECAUTIONS: Shoulder; see protocol  WEIGHT BEARING RESTRICTIONS Yes NWB  SUBJECTIVE: I have so much more movement today. It feels good.   PAIN:  Are you having pain? Yes: NPRS scale: 1/10 Pain  location: incision sites and bicep Pain description: Sore Aggravating factors: Movement  Relieving factors: ice  2 or 3/10 pain with activity    OBJECTIVE:  HAND DOMINANCE: Right     FUNCTIONAL OUTCOME MEASURES: Quick Dash: 65.91   UPPER EXTREMITY ROM      Tested with pt in supine, er/IR adducted  Passive ROM Right eval  Shoulder flexion 85  Shoulder abduction 55  Shoulder internal rotation 90  Shoulder external rotation 0  (Blank rows = not tested)     UPPER EXTREMITY MMT:      Not tested due to protocol  MMT Right eval  Shoulder flexion    Shoulder abduction    Shoulder internal rotation    Shoulder external rotation    (Blank rows = not tested)     PATIENT EDUCATION: Education details: Continue with HEP, ice for swelling Person educated: Patient Education method: Explanation Education comprehension: verbalized understanding     HOME EXERCISE PROGRAM: Eval: Table Slides; 6/30: shoulder isometrics (25% force), seated scapular AROM   GOALS: Goals reviewed with patient? Yes   SHORT TERM GOALS: Target date: 05/08/22   Pt will be provided with and educated on HEP to improve mobility in RUE required for ADL completion.  Goal status: INITIAL   2.  Pt will decrease pain with movement to 4/10 or less in order to sleep for 4+ consecutive hours without waking due to pain. Goal status: INITIAL   3.  Pt will increase P/ROM in RUE to [facility] to improve ability to perform dressing tasks with minimal compensatory  strategies. Goal status: INITIAL   4.  Pt will increase strength in RUE to 3/5 to improve ability to complete ADLs at waist or head height.  Goal status: INITIAL       LONG TERM GOALS: Target date:  06/05/22      Pt will decrease pain with movement to 1/10 or less in order to sleep through the night without waking due to pain.  Goal status: INITIAL   2.  Pt will decreased RUE fascial restrictions to minimal amounts or less to improve mobility required for  overhead reaching tasks.  Goal status: INITIAL   3.  Pt will increase A/ROM of RUE to [facility] to improve ability to reach overhead and behind back during dressing and bathing tasks.  Goal status: INITIAL   4.  Pt will increase strength in RUE to 4+/5 to improve ability to perform lifting tasks required for work around the house.  Goal status: INITIAL   TODAY'S TREATMENT:  04/24/22 -Manual Therapy: Soft tissue mobilization and myofascial release completed to upper quadrant to address restrictions. Emphasis on triceps and subscapularis  -P/ROM: Pt in supine, 1x10 each shoulder flexion, abduction, IR/er -Isometrics 5x5" flexion, extension, abduction, 25% effort -Scapular AROM: seated, 2x10 retraction, extension, elevation/depression  -Cervical ROM: lateral flexion, flexion, extension, 2x10 seconds each direction  04/14/22 -Manual Therapy: Soft tissue mobilization and myofascial release completed to upper quadrant to address restrictions. Emphasis on upper trapezius and posterior shoulder -P/ROM: Pt in supine, 1x10 each shoulder flexion, abduction, IR/er -Isometrics 5x5" flexion, extension, abduction, 25% effort -Scapular AROM: seated, 2x10 retraction, extension, elevation/depression    04/12/22 -Manual Therapy: Soft tissue mobilization and myofascial release completed to upper quadrant to address restrictions  -P/ROM: Pt in supine, 1x10 each shoulder flexion, abduction, IR/er -Scapular ROM: seated, 1x10 retraction, extension, elevation/depression  -Ball slides: seated, 1x10 each abduction and forward flexion  -Isometrics 5x5" flexion, extension, abduction, 25% effort  ASSESSMENT:   CLINICAL IMPRESSION: A: Pt continues with decreased levels of fascial restriction throughout biceps. Tight muscle fibers found throughout triceps and at subscapularis with release in these areas resulting in greater available P/ROM. Therapist providing verbal cues for deep breathing throughout and completing  passive stretching within pain free movement, pt reporting a "stretch". Tactile cues for scapular ROM to prevent compensation. Pt is progressing well with increased P/ROM noted today.   PLAN:   OT FREQUENCY: 3x/week for 4 weeks, 2x/week for 4 weeks  OT DURATION: 8 weeks  PLANNED INTERVENTIONS: self care/ADL training, therapeutic exercise, therapeutic activity, manual therapy, scar mobilization, passive range of motion, ultrasound, moist heat, cryotherapy, patient/family education, DME and/or AE instructions, and Re-evaluation  RECOMMENDED OTHER SERVICES: Pt will benefit from skilled OT services to improve ROM and strength and decreased pain and fascial restrictions in order to return to prior level of independence with all functional activities.   CONSULTED AND AGREED WITH PLAN OF CARE: Patient  PLAN FOR NEXT SESSION: P: Continue to progress with protocol, discuss discontinuing sling starting 7/17.     [address] [place], OTR/L [phone]  04/24/2022, 1:10 PM

## 2022-04-26 ENCOUNTER — Encounter (HOSPITAL_COMMUNITY): Payer: Self-pay

## 2022-04-26 ENCOUNTER — Ambulatory Visit (HOSPITAL_COMMUNITY): Payer: Self-pay

## 2022-04-26 DIAGNOSIS — R29898 Other symptoms and signs involving the musculoskeletal system: Secondary | ICD-10-CM

## 2022-04-26 DIAGNOSIS — M25611 Stiffness of right shoulder, not elsewhere classified: Secondary | ICD-10-CM

## 2022-04-26 DIAGNOSIS — R278 Other lack of coordination: Secondary | ICD-10-CM

## 2022-04-26 DIAGNOSIS — M25511 Pain in right shoulder: Secondary | ICD-10-CM

## 2022-04-26 NOTE — Therapy (Signed)
OUTPATIENT OCCUPATIONAL THERAPY TREATMENT NOTE   Patient Name: Michael Conley MRN: [id number] DOB:1996/03/27, 26 y.o., male Today's Date: 04/26/2022    PCP: Elfredia Nevins, MD  REFERRING PROVIDER: Ramond Marrow, MD  END OF SESSION:   OT End of Session - 04/26/22 1437     Visit Number 5    Number of Visits 20    Date for OT Re-Evaluation 06/09/22   mini reassessment 05/08/22   Authorization Type [employer]    Authorization Time Period No Auth Required    OT Start Time 1433    OT Stop Time 1513    OT Time Calculation (min) 40 min    Activity Tolerance Patient tolerated treatment well    Behavior During Therapy WFL for tasks assessed/performed             Past Medical History:  Diagnosis Date   Dislocated shoulder 12/15/2014   right   Past Surgical History:  Procedure Laterality Date   ORCHIOPEXY     TYMPANOSTOMY TUBE PLACEMENT     WISDOM TOOTH EXTRACTION     Patient Active Problem List   Diagnosis Date Noted   Dislocation of shoulder, anterior, right, closed 09/05/2013    ONSET DATE: 04/03/22  REFERRING DIAG: Right Latarjet Coracoid Bone Transfer  THERAPY DIAG:  Other symptoms and signs involving the musculoskeletal system  Other lack of coordination  Acute pain of right shoulder  Stiffness of right shoulder, not elsewhere classified  Rationale for Evaluation and Treatment Rehabilitation  PERTINENT HISTORY:  Pt is a 26 year old male who presents s/p right Latarjet Coracoid Bone Transfer completed 04/03/22. He has a history of shoulder dislocations and a right shoulder Bankart reconstruction 01/01/15. Dr. Everardo Pacific referred pt to occupational therapy for evaluation and treatment.  Pt presents to evaluation with DonJoy abduction sling.   PRECAUTIONS: Shoulder; see protocol  WEIGHT BEARING RESTRICTIONS Yes NWB  SUBJECTIVE: I am moving at the start of next month.   PAIN:  Are you having pain? Yes: NPRS scale: 1/10 Pain location: incision sites and  bicep Pain description: Sore Aggravating factors: Movement  Relieving factors: ice  2/10 pain with activity    OBJECTIVE:  HAND DOMINANCE: Right     FUNCTIONAL OUTCOME MEASURES: Quick Dash: 65.91   UPPER EXTREMITY ROM      Tested with pt in supine, er/IR adducted  Passive ROM Right eval  Shoulder flexion 85  Shoulder abduction 55  Shoulder internal rotation 90  Shoulder external rotation 0  (Blank rows = not tested)     UPPER EXTREMITY MMT:      Not tested due to protocol  MMT Right eval  Shoulder flexion    Shoulder abduction    Shoulder internal rotation    Shoulder external rotation    (Blank rows = not tested)     PATIENT EDUCATION: Education details: Continue with HEP, ice for swelling Person educated: Patient Education method: Explanation Education comprehension: verbalized understanding     HOME EXERCISE PROGRAM: Eval: Table Slides; 6/30: shoulder isometrics (25% force), seated scapular AROM   GOALS: Goals reviewed with patient? Yes   SHORT TERM GOALS: Target date: 05/08/22   Pt will be provided with and educated on HEP to improve mobility in RUE required for ADL completion.  Goal status: INITIAL   2.  Pt will decrease pain with movement to 4/10 or less in order to sleep for 4+ consecutive hours without waking due to pain. Goal status: INITIAL   3.  Pt  will increase P/ROM in RUE to [facility] to improve ability to perform dressing tasks with minimal compensatory strategies. Goal status: INITIAL   4.  Pt will increase strength in RUE to 3/5 to improve ability to complete ADLs at waist or head height.  Goal status: INITIAL       LONG TERM GOALS: Target date:  06/05/22      Pt will decrease pain with movement to 1/10 or less in order to sleep through the night without waking due to pain.  Goal status: INITIAL   2.  Pt will decreased RUE fascial restrictions to minimal amounts or less to improve mobility required for overhead reaching tasks.  Goal  status: INITIAL   3.  Pt will increase A/ROM of RUE to [facility] to improve ability to reach overhead and behind back during dressing and bathing tasks.  Goal status: INITIAL   4.  Pt will increase strength in RUE to 4+/5 to improve ability to perform lifting tasks required for work around the house.  Goal status: INITIAL   TODAY'S TREATMENT:  04/26/22 -Manual therapy: Soft tissue mobilization and myofascial release completed to upper quadrant to address restrictions. Emphasis on triceps and pectoralis  -P/ROM: Pt in supine, 1x10 each shoulder flexion, abduction, IR/er -Scapular ROM: 2x10 row, extension, shrugs  -Isometrics: 5x5" flexion, extension, abduction, 25%   04/24/22 -Manual Therapy: Soft tissue mobilization and myofascial release completed to upper quadrant to address restrictions. Emphasis on triceps and subscapularis  -P/ROM: Pt in supine, 1x10 each shoulder flexion, abduction, IR/er -Isometrics 5x5" flexion, extension, abduction, 25% effort -Scapular AROM: seated, 2x10 retraction, extension, elevation/depression  -Cervical ROM: lateral flexion, flexion, extension, 2x10 seconds each direction  04/14/22 -Manual Therapy: Soft tissue mobilization and myofascial release completed to upper quadrant to address restrictions. Emphasis on upper trapezius and posterior shoulder -P/ROM: Pt in supine, 1x10 each shoulder flexion, abduction, IR/er -Isometrics 5x5" flexion, extension, abduction, 25% effort -Scapular AROM: seated, 2x10 retraction, extension, elevation/depression    ASSESSMENT:   CLINICAL IMPRESSION: A: Pt continues to tolerate passive stretching, slight discomfort at end range, working within pain free movement. Fascial restrictions at the pectoralis, triceps and posterior deltoid addressed today with pt reporting discomfort following by some release. Pt requiring minimal tactile cues for positioning and form with scapular ROM and diminishing verbal cues for deep breathing  throughout passive stretch. Pt is progressing well, discussed plan for sling discontinuation.    PLAN:   OT FREQUENCY: 3x/week for 4 weeks, 2x/week for 4 weeks  OT DURATION: 8 weeks  PLANNED INTERVENTIONS: self care/ADL training, therapeutic exercise, therapeutic activity, manual therapy, scar mobilization, passive range of motion, ultrasound, moist heat, cryotherapy, patient/family education, DME and/or AE instructions, and Re-evaluation  RECOMMENDED OTHER SERVICES: Pt will benefit from skilled OT services to improve ROM and strength and decreased pain and fascial restrictions in order to return to prior level of independence with all functional activities.   CONSULTED AND AGREED WITH PLAN OF CARE: Patient  PLAN FOR NEXT SESSION: P: Continue to progress with protocol, discuss discontinuing sling starting 7/17. Phase 2 beginning 7/31.     [address], [place], OTR/L [phone]  04/26/2022, 4:08 PM

## 2022-04-28 ENCOUNTER — Ambulatory Visit (HOSPITAL_COMMUNITY): Payer: Self-pay

## 2022-04-28 ENCOUNTER — Encounter (HOSPITAL_COMMUNITY): Payer: Self-pay

## 2022-04-28 DIAGNOSIS — M25511 Pain in right shoulder: Secondary | ICD-10-CM

## 2022-04-28 DIAGNOSIS — R29898 Other symptoms and signs involving the musculoskeletal system: Secondary | ICD-10-CM

## 2022-04-28 DIAGNOSIS — M25611 Stiffness of right shoulder, not elsewhere classified: Secondary | ICD-10-CM

## 2022-04-28 DIAGNOSIS — R278 Other lack of coordination: Secondary | ICD-10-CM

## 2022-04-28 NOTE — Therapy (Signed)
OUTPATIENT OCCUPATIONAL THERAPY TREATMENT NOTE   Patient Name: Michael Conley MRN: [id number] DOB:[birth year], 26 y.o., male Today's Date: 04/28/2022    PCP: Elfredia Nevins, MD  REFERRING PROVIDER: Ramond Marrow, MD  END OF SESSION:   OT End of Session - 04/28/22 1126     Visit Number 6    Number of Visits 20    Date for OT Re-Evaluation 06/09/22   mini reassessment 05/08/22   Authorization Type [employer]    Authorization Time Period No Auth Required    OT Start Time 1123    OT Stop Time 1158    OT Time Calculation (min) 35 min    Activity Tolerance Patient tolerated treatment well    Behavior During Therapy WFL for tasks assessed/performed             Past Medical History:  Diagnosis Date   Dislocated shoulder 12/15/2014   right   Past Surgical History:  Procedure Laterality Date   ORCHIOPEXY     TYMPANOSTOMY TUBE PLACEMENT     WISDOM TOOTH EXTRACTION     Patient Active Problem List   Diagnosis Date Noted   Dislocation of shoulder, anterior, right, closed 09/05/2013    ONSET DATE: 04/03/22  REFERRING DIAG: Right Latarjet Coracoid Bone Transfer  THERAPY DIAG:  Other symptoms and signs involving the musculoskeletal system  Other lack of coordination  Acute pain of right shoulder  Stiffness of right shoulder, not elsewhere classified  Rationale for Evaluation and Treatment Rehabilitation  PERTINENT HISTORY:  Pt is a 26 year old male who presents s/p right Latarjet Coracoid Bone Transfer completed 04/03/22. He has a history of shoulder dislocations and a right shoulder Bankart reconstruction 01/01/15. Dr. Everardo Pacific referred pt to occupational therapy for evaluation and treatment.  Pt presents to evaluation with DonJoy abduction sling.   PRECAUTIONS: Shoulder; see protocol  WEIGHT BEARING RESTRICTIONS Yes NWB  SUBJECTIVE: Pt arrived without his sling, says that he has been wearing it most of the day when doing activity  PAIN:  Are you having pain? Yes:  NPRS scale: 1/10 Pain location: incision sites and bicep Pain description: Sore Aggravating factors: Movement  Relieving factors: ice  2/10 pain with activity    OBJECTIVE:  HAND DOMINANCE: Right     FUNCTIONAL OUTCOME MEASURES: Quick Dash: 65.91   UPPER EXTREMITY ROM      Tested with pt in supine, er/IR adducted  Passive ROM Right eval  Shoulder flexion 85  Shoulder abduction 55  Shoulder internal rotation 90  Shoulder external rotation 0  (Blank rows = not tested)     UPPER EXTREMITY MMT:      Not tested due to protocol  MMT Right eval  Shoulder flexion    Shoulder abduction    Shoulder internal rotation    Shoulder external rotation    (Blank rows = not tested)     PATIENT EDUCATION: Education details: Continue with HEP, bicep flexion and extension  Person educated: Patient Education method: Explanation Education comprehension: verbalized understanding     HOME EXERCISE PROGRAM: Eval: Table Slides; 6/30: shoulder isometrics (25% force), seated scapular AROM 04/28/22: bicep flexion and extension   GOALS: Goals reviewed with patient? Yes   SHORT TERM GOALS: Target date: 05/08/22   Pt will be provided with and educated on HEP to improve mobility in RUE required for ADL completion.  Goal status: INITIAL   2.  Pt will decrease pain with movement to 4/10 or less in order to sleep for  4+ consecutive hours without waking due to pain. Goal status: INITIAL   3.  Pt will increase P/ROM in RUE to [facility] to improve ability to perform dressing tasks with minimal compensatory strategies. Goal status: INITIAL   4.  Pt will increase strength in RUE to 3/5 to improve ability to complete ADLs at waist or head height.  Goal status: INITIAL       LONG TERM GOALS: Target date:  06/05/22      Pt will decrease pain with movement to 1/10 or less in order to sleep through the night without waking due to pain.  Goal status: INITIAL   2.  Pt will decreased RUE fascial  restrictions to minimal amounts or less to improve mobility required for overhead reaching tasks.  Goal status: INITIAL   3.  Pt will increase A/ROM of RUE to [facility] to improve ability to reach overhead and behind back during dressing and bathing tasks.  Goal status: INITIAL   4.  Pt will increase strength in RUE to 4+/5 to improve ability to perform lifting tasks required for work around the house.  Goal status: INITIAL   TODAY'S TREATMENT:  04/26/22 -Manual therapy: Soft tissue mobilization and myofascial release completed to upper quadrant to address restrictions. Emphasis on deltoids -P/ROM: Pt in supine, 1x10 each shoulder flexion, abduction, IR/er -Scapular ROM: 2x10 row, extension, shrugs, circles -Isometrics: 5x5" flexion, extension, abduction, 25% -Ball slides: AA/ROM forward flexion and abduction, 1x10   04/26/22 -Manual therapy: Soft tissue mobilization and myofascial release completed to upper quadrant to address restrictions. Emphasis on triceps and pectoralis  -P/ROM: Pt in supine, 1x10 each shoulder flexion, abduction, IR/er -Scapular ROM: 2x10 row, extension, shrugs  -Isometrics: 5x5" flexion, extension, abduction, 25%   04/24/22 -Manual Therapy: Soft tissue mobilization and myofascial release completed to upper quadrant to address restrictions. Emphasis on triceps and subscapularis  -P/ROM: Pt in supine, 1x10 each shoulder flexion, abduction, IR/er -Isometrics 5x5" flexion, extension, abduction, 25% effort -Scapular AROM: seated, 2x10 retraction, extension, elevation/depression  -Cervical ROM: lateral flexion, flexion, extension, 2x10 seconds each direction    ASSESSMENT:   CLINICAL IMPRESSION: A: Continued with Phase II of protocol with P/ROM, pt reporting feeling "less restricted" today and able to achieve shoulder flexion to approximately 150 degrees. Slight improvement noted with external  rotation today. Tight muscles fiber at deltoids addressed to decrease  muscle restriction and discomfort. Min tactile cues required for proper form with exercises today with pt demonstrating good muscle recruitment for scapular ROM. Discussed receiving a referral for OT services when pt moves. Pt continues to progress as expected and will be appropriate for late phase II.   PLAN:   OT FREQUENCY: 3x/week for 4 weeks, 2x/week for 4 weeks  OT DURATION: 8 weeks  PLANNED INTERVENTIONS: self care/ADL training, therapeutic exercise, therapeutic activity, manual therapy, scar mobilization, passive range of motion, ultrasound, moist heat, cryotherapy, patient/family education, DME and/or AE instructions, and Re-evaluation  RECOMMENDED OTHER SERVICES: Pt will benefit from skilled OT services to improve ROM and strength and decreased pain and fascial restrictions in order to return to prior level of independence with all functional activities.   CONSULTED AND AGREED WITH PLAN OF CARE: Patient  PLAN FOR NEXT SESSION: P: Continue to progress with protocol, discuss discontinuing sling starting 7/17. Late phase 2 beginning 7/31.     Perley Jain, [place], OTR/L [phone]  04/28/2022, 12:03 PM

## 2022-05-01 ENCOUNTER — Ambulatory Visit (HOSPITAL_COMMUNITY): Payer: Self-pay

## 2022-05-01 ENCOUNTER — Encounter (HOSPITAL_COMMUNITY): Payer: Self-pay

## 2022-05-01 DIAGNOSIS — M25511 Pain in right shoulder: Secondary | ICD-10-CM

## 2022-05-01 DIAGNOSIS — R29898 Other symptoms and signs involving the musculoskeletal system: Secondary | ICD-10-CM

## 2022-05-01 DIAGNOSIS — R278 Other lack of coordination: Secondary | ICD-10-CM

## 2022-05-01 DIAGNOSIS — M25611 Stiffness of right shoulder, not elsewhere classified: Secondary | ICD-10-CM

## 2022-05-01 NOTE — Therapy (Signed)
OUTPATIENT OCCUPATIONAL THERAPY TREATMENT NOTE   Patient Name: Michael Conley MRN: [id number] DOB:May 13, 1996, 26 y.o., male Today's Date: 05/01/2022    PCP: Elfredia Nevins, MD  REFERRING PROVIDER: Ramond Marrow, MD  END OF SESSION:   OT End of Session - 05/01/22 1303     Visit Number 7    Number of Visits 20    Date for OT Re-Evaluation 06/09/22   mini reassessment 05/08/22   Authorization Type [employer]    Authorization Time Period No Auth Required    OT Start Time 1300    OT Stop Time 1345    OT Time Calculation (min) 45 min    Activity Tolerance Patient tolerated treatment well    Behavior During Therapy WFL for tasks assessed/performed             Past Medical History:  Diagnosis Date   Dislocated shoulder 12/15/2014   right   Past Surgical History:  Procedure Laterality Date   ORCHIOPEXY     TYMPANOSTOMY TUBE PLACEMENT     WISDOM TOOTH EXTRACTION     Patient Active Problem List   Diagnosis Date Noted   Dislocation of shoulder, anterior, right, closed 09/05/2013    ONSET DATE: 04/03/22  REFERRING DIAG: Right Latarjet Coracoid Bone Transfer  THERAPY DIAG:  Other symptoms and signs involving the musculoskeletal system  Other lack of coordination  Acute pain of right shoulder  Stiffness of right shoulder, not elsewhere classified  Rationale for Evaluation and Treatment Rehabilitation  PERTINENT HISTORY:  Pt is a 26 year old male who presents s/p right Latarjet Coracoid Bone Transfer completed 04/03/22. He has a history of shoulder dislocations and a right shoulder Bankart reconstruction 01/01/15. Dr. Everardo Pacific referred pt to occupational therapy for evaluation and treatment.  Pt presents to evaluation with DonJoy abduction sling.   PRECAUTIONS: Shoulder; see protocol  WEIGHT BEARING RESTRICTIONS Yes NWB  SUBJECTIVE: Pt arrived without his sling, reports that he is wearing sling at night and about 25% of the day. "I miss the sling sometimes, I wear it  when my arm starts to feel tired."  PAIN:  Are you having pain? No  1/10 pain with activity   OBJECTIVE:  HAND DOMINANCE: Right     FUNCTIONAL OUTCOME MEASURES: Quick Dash: 65.91   UPPER EXTREMITY ROM      Tested with pt in supine, er/IR adducted  Passive ROM Right eval  Shoulder flexion 85  Shoulder abduction 55  Shoulder internal rotation 90  Shoulder external rotation 0  (Blank rows = not tested)     UPPER EXTREMITY MMT:      Not tested due to protocol  MMT Right eval  Shoulder flexion    Shoulder abduction    Shoulder internal rotation    Shoulder external rotation    (Blank rows = not tested)     PATIENT EDUCATION: Education details: Continue with HEP, bicep flexion and extension  Person educated: Patient Education method: Explanation Education comprehension: verbalized understanding     HOME EXERCISE PROGRAM: Eval: Table Slides; 6/30: shoulder isometrics (25% force), seated scapular AROM 04/28/22: bicep flexion and extension   GOALS: Goals reviewed with patient? Yes   SHORT TERM GOALS: Target date: 05/08/22   Pt will be provided with and educated on HEP to improve mobility in RUE required for ADL completion.  Goal status: INITIAL   2.  Pt will decrease pain with movement to 4/10 or less in order to sleep for 4+ consecutive hours without waking  due to pain. Goal status: INITIAL   3.  Pt will increase P/ROM in RUE to [facility] to improve ability to perform dressing tasks with minimal compensatory strategies. Goal status: INITIAL   4.  Pt will increase strength in RUE to 3/5 to improve ability to complete ADLs at waist or head height.  Goal status: INITIAL       LONG TERM GOALS: Target date:  06/05/22      Pt will decrease pain with movement to 1/10 or less in order to sleep through the night without waking due to pain.  Goal status: INITIAL   2.  Pt will decreased RUE fascial restrictions to minimal amounts or less to improve mobility required for  overhead reaching tasks.  Goal status: INITIAL   3.  Pt will increase A/ROM of RUE to [facility] to improve ability to reach overhead and behind back during dressing and bathing tasks.  Goal status: INITIAL   4.  Pt will increase strength in RUE to 4+/5 to improve ability to perform lifting tasks required for work around the house.  Goal status: INITIAL   TODAY'S TREATMENT:  05/01/22 -Manual therapy: Soft tissue mobilization and myofascial release completed to upper quadrant to address restrictions. Emphasis on pectoralis and distal triceps  -P/ROM: Pt in supine, 1x10 each shoulder flexion, abduction, IR/er -Scapular ROM: 2x10 row, extension, shrugs, circles -Isometrics: 5x5" flexion, extension, abduction, 25%   04/26/22 -Manual therapy: Soft tissue mobilization and myofascial release completed to upper quadrant to address restrictions. Emphasis on deltoids -P/ROM: Pt in supine, 1x10 each shoulder flexion, abduction, IR/er -Scapular ROM: 2x10 row, extension, shrugs, circles -Isometrics: 5x5" flexion, extension, abduction, 25% -Ball slides: AA/ROM forward flexion and abduction, 1x10   04/26/22 -Manual therapy: Soft tissue mobilization and myofascial release completed to upper quadrant to address restrictions. Emphasis on triceps and pectoralis  -P/ROM: Pt in supine, 1x10 each shoulder flexion, abduction, IR/er -Scapular ROM: 2x10 row, extension, shrugs  -Isometrics: 5x5" flexion, extension, abduction, 25%   ASSESSMENT:   CLINICAL IMPRESSION: A: Completed manual therapy, myofascial release and pin and stretch, throughout passive stretching today to address restrictions as they presented. Fascial restrictions noted at the pectoralis, serratus anterior, and distal biceps. Increased P/ROM noted after manual techniques. Continues with the most restriction and end range slight pain with external rotation. Therapist providing minimal verbal cues for scapular ROM, pt able to complete with more  fluidity today.     PLAN:   OT FREQUENCY: 3x/week for 4 weeks, 2x/week for 4 weeks  OT DURATION: 8 weeks  PLANNED INTERVENTIONS: self care/ADL training, therapeutic exercise, therapeutic activity, manual therapy, scar mobilization, passive range of motion, ultrasound, moist heat, cryotherapy, patient/family education, DME and/or AE instructions, and Re-evaluation  RECOMMENDED OTHER SERVICES: Pt will benefit from skilled OT services to improve ROM and strength and decreased pain and fascial restrictions in order to return to prior level of independence with all functional activities.   CONSULTED AND AGREED WITH PLAN OF CARE: Patient  PLAN FOR NEXT SESSION: P: Continue to progress with protocol, discuss discontinuing sling starting 7/17. Late phase 2 beginning 7/31.     Perley Jain, [place], OTR/L [phone]  05/01/2022, 3:47 PM

## 2022-05-03 ENCOUNTER — Ambulatory Visit (HOSPITAL_COMMUNITY): Payer: Self-pay

## 2022-05-04 ENCOUNTER — Encounter (HOSPITAL_COMMUNITY): Payer: Self-pay

## 2022-05-04 ENCOUNTER — Ambulatory Visit (HOSPITAL_COMMUNITY): Payer: Self-pay

## 2022-05-04 DIAGNOSIS — R29898 Other symptoms and signs involving the musculoskeletal system: Secondary | ICD-10-CM

## 2022-05-04 DIAGNOSIS — R278 Other lack of coordination: Secondary | ICD-10-CM

## 2022-05-04 DIAGNOSIS — M25511 Pain in right shoulder: Secondary | ICD-10-CM

## 2022-05-04 DIAGNOSIS — M25611 Stiffness of right shoulder, not elsewhere classified: Secondary | ICD-10-CM

## 2022-05-04 NOTE — Therapy (Signed)
OUTPATIENT OCCUPATIONAL THERAPY TREATMENT NOTE   Patient Name: Michael Conley MRN: [id number] DOB:03-20-1996, 26 y.o., male Today's Date: 05/04/2022    PCP: Elfredia Nevins, MD  REFERRING PROVIDER: Ramond Marrow, MD  END OF SESSION:   OT End of Session - 05/04/22 1312     Visit Number 8    Number of Visits 20    Date for OT Re-Evaluation 06/09/22   mini reassessment 05/08/22   Authorization Type [employer]    Authorization Time Period No Auth Required    OT Start Time 0108    OT Stop Time 0146    OT Time Calculation (min) 38 min    Activity Tolerance Patient tolerated treatment well    Behavior During Therapy [facility] for tasks assessed/performed             Past Medical History:  Diagnosis Date   Dislocated shoulder 12/15/2014   right   Past Surgical History:  Procedure Laterality Date   ORCHIOPEXY     TYMPANOSTOMY TUBE PLACEMENT     WISDOM TOOTH EXTRACTION     Patient Active Problem List   Diagnosis Date Noted   Dislocation of shoulder, anterior, right, closed 09/05/2013    ONSET DATE: 04/03/22  REFERRING DIAG: Right Latarjet Coracoid Bone Transfer  THERAPY DIAG:  Other symptoms and signs involving the musculoskeletal system  Other lack of coordination  Acute pain of right shoulder  Stiffness of right shoulder, not elsewhere classified  Rationale for Evaluation and Treatment Rehabilitation  PERTINENT HISTORY:  Pt is a 26 year old male who presents s/p right Latarjet Coracoid Bone Transfer completed 04/03/22. He has a history of shoulder dislocations and a right shoulder Bankart reconstruction 01/01/15. Dr. Everardo Pacific referred pt to occupational therapy for evaluation and treatment.  Pt presents to evaluation with DonJoy abduction sling.   PRECAUTIONS: Shoulder; see protocol  WEIGHT BEARING RESTRICTIONS Yes NWB  SUBJECTIVE: Pt arrived without his sling, reports that he only wears it occasionally when he needs a break. Due to changes in employment, pt reports  concerns regarding insurance coverage for future therapy visits  PAIN:  Are you having pain? No  1/10 pain with activity   OBJECTIVE:  HAND DOMINANCE: Right     FUNCTIONAL OUTCOME MEASURES: Quick Dash: 65.91   UPPER EXTREMITY ROM      Tested with pt in supine, er/IR adducted  Passive ROM Right eval  Shoulder flexion 85  Shoulder abduction 55  Shoulder internal rotation 90  Shoulder external rotation 0  (Blank rows = not tested)     UPPER EXTREMITY MMT:      Not tested due to protocol  MMT Right eval  Shoulder flexion    Shoulder abduction    Shoulder internal rotation    Shoulder external rotation    (Blank rows = not tested)     PATIENT EDUCATION: Education details: Continue with HEP, bicep flexion and extension  Person educated: Patient Education method: Explanation Education comprehension: verbalized understanding     HOME EXERCISE PROGRAM: Eval: Table Slides; 6/30: shoulder isometrics (25% force), seated scapular AROM 04/28/22: bicep flexion and extension   GOALS: Goals reviewed with patient? Yes   SHORT TERM GOALS: Target date: 05/08/22   Pt will be provided with and educated on HEP to improve mobility in RUE required for ADL completion.  Goal status: INITIAL   2.  Pt will decrease pain with movement to 4/10 or less in order to sleep for 4+ consecutive hours without waking due to  pain. Goal status: INITIAL   3.  Pt will increase P/ROM in RUE to [facility] to improve ability to perform dressing tasks with minimal compensatory strategies. Goal status: INITIAL   4.  Pt will increase strength in RUE to 3/5 to improve ability to complete ADLs at waist or head height.  Goal status: INITIAL       LONG TERM GOALS: Target date:  06/05/22      Pt will decrease pain with movement to 1/10 or less in order to sleep through the night without waking due to pain.  Goal status: INITIAL   2.  Pt will decreased RUE fascial restrictions to minimal amounts or less to  improve mobility required for overhead reaching tasks.  Goal status: INITIAL   3.  Pt will increase A/ROM of RUE to [facility] to improve ability to reach overhead and behind back during dressing and bathing tasks.  Goal status: INITIAL   4.  Pt will increase strength in RUE to 4+/5 to improve ability to perform lifting tasks required for work around the house.  Goal status: INITIAL   TODAY'S TREATMENT:  05/04/22 -Manual therapy: Soft tissue mobilization and myofascial release completed to upper quadrant to address restrictions. Emphasis on pectoralis and distal triceps  -P/ROM: Pt in supine, 1x10 each shoulder flexion, abduction, IR/er, horizontal adduction -AA/ROM: 1x10 protraction, shoulder flexion, IR/er, abduction, horizontal abduction/adduction -Proximal Shoulder Strengthening: 30 seconds each movement, no break, criss cross, paddles, circles    05/01/22 -Manual therapy: Soft tissue mobilization and myofascial release completed to upper quadrant to address restrictions. Emphasis on pectoralis and distal triceps  -P/ROM: Pt in supine, 1x10 each shoulder flexion, abduction, IR/er -Scapular ROM: 2x10 row, extension, shrugs, circles -Isometrics: 5x5" flexion, extension, abduction, 25%   04/26/22 -Manual therapy: Soft tissue mobilization and myofascial release completed to upper quadrant to address restrictions. Emphasis on deltoids -P/ROM: Pt in supine, 1x10 each shoulder flexion, abduction, IR/er -Scapular ROM: 2x10 row, extension, shrugs, circles -Isometrics: 5x5" flexion, extension, abduction, 25% -Ball slides: AA/ROM forward flexion and abduction, 1x10      ASSESSMENT:   CLINICAL IMPRESSION: A: Pt continues with passive shoulder flexion WFL and no pain, shoulder abduction to approximately 130 degrees and external rotation to 35 degrees. Progressed to AA/ROM today with therapist providing demonstration and minimal tactile cues required for form. Pt able to complete AA/ROM in all  shoulder directions with minimal compensations, good form, and no increased pain. Able to achieve AA/ROM equal to P/ROM.     PLAN:   OT FREQUENCY: 3x/week for 4 weeks, 2x/week for 4 weeks  OT DURATION: 8 weeks  PLANNED INTERVENTIONS: self care/ADL training, therapeutic exercise, therapeutic activity, manual therapy, scar mobilization, passive range of motion, ultrasound, moist heat, cryotherapy, patient/family education, DME and/or AE instructions, and Re-evaluation  RECOMMENDED OTHER SERVICES: Pt will benefit from skilled OT services to improve ROM and strength and decreased pain and fascial restrictions in order to return to prior level of independence with all functional activities.   CONSULTED AND AGREED WITH PLAN OF CARE: Patient  PLAN FOR NEXT SESSION: P: Take measurements, potential discharge with HEP due to [employer], [place], OTR/L [phone]  05/04/2022, 2:27 PM

## 2022-05-04 NOTE — Patient Instructions (Signed)

## 2022-05-11 ENCOUNTER — Encounter (HOSPITAL_COMMUNITY): Payer: BC Managed Care – PPO

## 2022-05-12 ENCOUNTER — Encounter (HOSPITAL_COMMUNITY): Payer: BC Managed Care – PPO | Admitting: Occupational Therapy

## 2022-05-15 ENCOUNTER — Telehealth (HOSPITAL_COMMUNITY): Payer: Self-pay

## 2022-05-15 ENCOUNTER — Ambulatory Visit (HOSPITAL_COMMUNITY): Payer: Self-pay

## 2022-05-15 NOTE — Telephone Encounter (Signed)
Called pt regarding no show, no answer, message left instructing pt to call front desk to discuss plan for future therapy visits as pt is planning to move out of state soon.

## 2022-05-17 ENCOUNTER — Ambulatory Visit (HOSPITAL_COMMUNITY): Payer: Self-pay | Attending: Orthopaedic Surgery

## 2022-05-23 ENCOUNTER — Encounter (HOSPITAL_COMMUNITY): Payer: Self-pay

## 2022-05-23 DIAGNOSIS — M25611 Stiffness of right shoulder, not elsewhere classified: Secondary | ICD-10-CM

## 2022-05-23 DIAGNOSIS — R278 Other lack of coordination: Secondary | ICD-10-CM

## 2022-05-23 DIAGNOSIS — M25511 Pain in right shoulder: Secondary | ICD-10-CM

## 2022-05-23 DIAGNOSIS — R29898 Other symptoms and signs involving the musculoskeletal system: Secondary | ICD-10-CM

## 2022-05-23 NOTE — Therapy (Signed)
OUTPATIENT OCCUPATIONAL THERAPY Discharge Note  OCCUPATIONAL THERAPY DISCHARGE SUMMARY  Visits from Start of Care: 8  Current functional level related to goals / functional outcomes: Pt has met all STG and is progressing as expected through protocol.    Remaining deficits: Strength and ROM limited by post surgical protocol   Education / Equipment: HEP provided including theraband   Plan: Patient is being [place] due [place].        Patient Name: Michael Conley MRN: [id number] DOB:1996/03/31, 26 y.o., male Today's Date: 05/23/2022    PCP: Michael School, MD  REFERRING PROVIDER: Ophelia Charter, MD  END OF SESSION:     Past Medical History:  Diagnosis Date   Dislocated shoulder 12/15/2014   right   Past Surgical History:  Procedure Laterality Date   ORCHIOPEXY     TYMPANOSTOMY TUBE PLACEMENT     WISDOM TOOTH EXTRACTION     Patient Active Problem List   Diagnosis Date Noted   Dislocation of shoulder, anterior, right, closed 09/05/2013    ONSET DATE: 04/03/22  REFERRING DIAG: Right Latarjet Coracoid Bone Transfer  THERAPY DIAG:  Other symptoms and signs involving the musculoskeletal system  Other lack of coordination  Acute pain of right shoulder  Stiffness of right shoulder, not elsewhere classified  Rationale for Evaluation and Treatment Rehabilitation  PERTINENT HISTORY:  Pt is a 26 year old male who presents s/p right Latarjet Coracoid Bone Transfer completed 04/03/22. He has a history of shoulder dislocations and a right shoulder Bankart reconstruction 01/01/15. Dr. Griffin Basil referred pt to occupational therapy for evaluation and treatment.  Pt presents to evaluation with DonJoy Conley sling.   PRECAUTIONS: Shoulder; see protocol  WEIGHT BEARING RESTRICTIONS Yes NWB  SUBJECTIVE: NA; discharge without visit  PAIN:  Are you having pain? No  1/10 pain with activity   OBJECTIVE:  HAND DOMINANCE: Right     FUNCTIONAL OUTCOME  MEASURES: Quick Dash: 65.91   UPPER EXTREMITY ROM      Tested with pt in supine, er/IR adducted  Passive ROM Right eval  Shoulder flexion 85  Shoulder Conley 55  Shoulder internal rotation 90  Shoulder external rotation 0  (Blank rows = not tested)     UPPER EXTREMITY MMT:      Not tested due to protocol  MMT Right eval  Shoulder flexion    Shoulder Conley    Shoulder internal rotation    Shoulder external rotation    (Blank rows = not tested)     PATIENT EDUCATION: Education details: Continue with HEP, bicep flexion and extension  Person educated: Patient Education method: Explanation Education comprehension: verbalized understanding     HOME EXERCISE PROGRAM: Eval: Table Slides; 6/30: shoulder isometrics (25% force), seated scapular AROM 04/28/22: bicep flexion and extension 7/20: AA/ROM   GOALS: Goals reviewed with patient? Yes   SHORT TERM GOALS: Target date: 05/08/22   Pt will be provided with and educated on HEP to improve mobility in RUE required for ADL completion.  Goal status: MET   2.  Pt will decrease pain with movement to 4/10 or less in order to sleep for 4+ consecutive hours without waking due to pain. Goal status: MET   3.  Pt will increase P/ROM in RUE to [facility] to improve ability to perform dressing tasks with minimal compensatory strategies. Goal status: MET   4.  Pt will increase strength in RUE to 3/5 to improve ability to complete ADLs at waist or head height.  Goal status: MET       LONG TERM GOALS: Target date:  06/05/22      Pt will decrease pain with movement to 1/10 or less in order to sleep through the night without waking due to pain.  Goal status: MET   2.  Pt will decreased RUE fascial restrictions to minimal amounts or less to improve mobility required for overhead reaching tasks.  Goal status: ongoing   3.  Pt will increase A/ROM of RUE to [facility] to improve ability to reach overhead and behind back during dressing and  bathing tasks.  Goal status: ongoing   4.  Pt will increase strength in RUE to 4+/5 to improve ability to perform lifting tasks required for work around the house.  Goal status: ongoing   TODAY'S TREATMENT:  05/04/22 -Manual therapy: Soft tissue mobilization and myofascial release completed to upper quadrant to address restrictions. Emphasis on pectoralis and distal triceps  -P/ROM: Pt in supine, 1x10 each shoulder flexion, Conley, IR/er, horizontal adduction -AA/ROM: 1x10 protraction, shoulder flexion, IR/er, Conley, horizontal Conley/adduction -Proximal Shoulder Strengthening: 30 seconds each movement, no break, criss cross, paddles, circles    05/01/22 -Manual therapy: Soft tissue mobilization and myofascial release completed to upper quadrant to address restrictions. Emphasis on pectoralis and distal triceps  -P/ROM: Pt in supine, 1x10 each shoulder flexion, Conley, IR/er -Scapular ROM: 2x10 row, extension, shrugs, circles -Isometrics: 5x5" flexion, extension, Conley, 25%   04/26/22 -Manual therapy: Soft tissue mobilization and myofascial release completed to upper quadrant to address restrictions. Emphasis on deltoids -P/ROM: Pt in supine, 1x10 each shoulder flexion, Conley, IR/er -Scapular ROM: 2x10 row, extension, shrugs, circles -Isometrics: 5x5" flexion, extension, Conley, 25% -Ball slides: AA/ROM forward flexion and Conley, 1x10      ASSESSMENT:   CLINICAL IMPRESSION: A:  Pt being discharged today due to moving out of state. Pt did not attended final two scheduled appointments, unable to take updated measurements, but based on skilled observation and performance within treatment session, pt progressing very well with protocol. Pt reports completing HEP daily and has a good understanding of precautions.     PLAN:   PLAN FOR NEXT SESSION: P: Discharge Today     Michael Conley, OTR/L [phone]  05/23/2022, 9:40 AM

## 2022-05-29 ENCOUNTER — Encounter (HOSPITAL_COMMUNITY): Payer: BC Managed Care – PPO

## 2022-05-31 ENCOUNTER — Encounter (HOSPITAL_COMMUNITY): Payer: BC Managed Care – PPO
